# Patient Record
Sex: Male | Born: 1945 | Race: White | Hispanic: No | Marital: Single | Smoking: Heavy tobacco smoker
Health system: Southern US, Community
[De-identification: ages and names within clinical notes are randomized; demographics above are authoritative.]

## PROBLEM LIST (undated history)

## (undated) DIAGNOSIS — C799 Secondary malignant neoplasm of unspecified site: Secondary | ICD-10-CM

## (undated) DIAGNOSIS — C61 Malignant neoplasm of prostate: Secondary | ICD-10-CM

---

## 2013-01-14 ENCOUNTER — Emergency Department (HOSPITAL_COMMUNITY): Payer: Medicare Other

## 2013-01-14 ENCOUNTER — Emergency Department (HOSPITAL_COMMUNITY)
Admission: EM | Admit: 2013-01-14 | Discharge: 2013-01-14 | Disposition: A | Discharge status: Home / Self Care | Payer: Medicare Other | Attending: Emergency Medicine | Admitting: Emergency Medicine

## 2013-01-14 ENCOUNTER — Encounter (HOSPITAL_COMMUNITY): Payer: Self-pay | Admitting: *Deleted

## 2013-01-14 DIAGNOSIS — R4182 Altered mental status, unspecified: Secondary | ICD-10-CM | POA: Insufficient documentation

## 2013-01-14 DIAGNOSIS — F17200 Nicotine dependence, unspecified, uncomplicated: Secondary | ICD-10-CM | POA: Insufficient documentation

## 2013-01-14 DIAGNOSIS — W19XXXA Unspecified fall, initial encounter: Secondary | ICD-10-CM

## 2013-01-14 DIAGNOSIS — F1010 Alcohol abuse, uncomplicated: Secondary | ICD-10-CM | POA: Insufficient documentation

## 2013-01-14 DIAGNOSIS — S0180XA Unspecified open wound of other part of head, initial encounter: Secondary | ICD-10-CM | POA: Insufficient documentation

## 2013-01-14 DIAGNOSIS — F10929 Alcohol use, unspecified with intoxication, unspecified: Secondary | ICD-10-CM

## 2013-01-14 DIAGNOSIS — Y939 Activity, unspecified: Secondary | ICD-10-CM | POA: Insufficient documentation

## 2013-01-14 DIAGNOSIS — Z23 Encounter for immunization: Secondary | ICD-10-CM | POA: Insufficient documentation

## 2013-01-14 DIAGNOSIS — IMO0002 Reserved for concepts with insufficient information to code with codable children: Secondary | ICD-10-CM | POA: Insufficient documentation

## 2013-01-14 DIAGNOSIS — S0181XA Laceration without foreign body of other part of head, initial encounter: Secondary | ICD-10-CM

## 2013-01-14 DIAGNOSIS — T07XXXA Unspecified multiple injuries, initial encounter: Secondary | ICD-10-CM

## 2013-01-14 DIAGNOSIS — W1809XA Striking against other object with subsequent fall, initial encounter: Secondary | ICD-10-CM | POA: Insufficient documentation

## 2013-01-14 DIAGNOSIS — Y9289 Other specified places as the place of occurrence of the external cause: Secondary | ICD-10-CM | POA: Insufficient documentation

## 2013-01-14 MED ORDER — TETANUS-DIPHTH-ACELL PERTUSSIS 5-2.5-18.5 LF-MCG/0.5 IM SUSP
0.5000 mL | Freq: Once | INTRAMUSCULAR | Status: AC
  Administered 2013-01-14: 0.5 mL via INTRAMUSCULAR

## 2013-01-14 MED ORDER — LORAZEPAM 2 MG/ML IJ SOLN
2.0000 mg | Freq: Once | INTRAMUSCULAR | Status: AC
  Administered 2013-01-14: 2 mg via INTRAMUSCULAR
  Filled 2013-01-14: qty 1

## 2013-01-14 MED ORDER — ZIPRASIDONE MESYLATE 20 MG IM SOLR
10.0000 mg | Freq: Once | INTRAMUSCULAR | Status: AC
  Administered 2013-01-14: 10 mg via INTRAMUSCULAR

## 2013-01-14 NOTE — ED Notes (Signed)
Patient transported to CT 

## 2013-01-14 NOTE — ED Notes (Signed)
MD at bedside. Restraints removed

## 2013-01-14 NOTE — ED Notes (Signed)
MD Bednar in to eval. Patient with unsteady gait. Will offer food and fluids to see if he tolerates.

## 2013-01-14 NOTE — ED Notes (Signed)
MD at bedside.  EDP Bednar updated on pt current at status and reports OK for discharge

## 2013-01-14 NOTE — ED Notes (Signed)
Patient was picked up by EMS after falling and hitting his head. Patient has a 2cm laceration to left eyebrow.  He states that he  Has ETOH on board.

## 2013-01-14 NOTE — ED Provider Notes (Signed)
Pt mumbles incoherently to voice stimulus, not following commands yet. 0915  Awake alert oriented x3 he states he lives alone follows commands able to sit unassisted and walk unassisted but still slightly ataxic gait will observe him further in the emergency department due to initial gait instability, admits to drinking heavily last night, denies threats to hurt himself or others .1130  Patient now walking well independently in the emergency department and feels ready for discharge.  Hurman Horn, MD 01/19/13 [phone]

## 2013-01-14 NOTE — ED Notes (Signed)
Patient continues to sleep waking up periodically with loud moaning.

## 2013-01-14 NOTE — ED Provider Notes (Signed)
History     CSN: [id number]  Arrival date & time 01/14/13  0207   First MD Initiated Contact with Patient 01/14/13 0230      Chief Complaint  Patient presents with  . Fall  . Facial Laceration    (Consider location/radiation/quality/duration/timing/severity/associated sxs/prior treatment) HPI 67 year old male presents emergency part via EMS and police after falling in a parking lot.  Patient is very intoxicated.  He was looking for his car, which had been towed.  Patient fell forward.  He is unable to me if he passed out.  Patient with lacerations and bleeding from his face and forehead  He reports his last tetanus shot was at age 79.  No other history is able to be obtained secondary to alcohol intoxication History reviewed. No pertinent past medical history.  History reviewed. No pertinent past surgical history.  History reviewed. No pertinent family history.  History  Substance Use Topics  . Smoking status: Heavy Tobacco Smoker  . Smokeless tobacco: Not on file  . Alcohol Use: Yes      Review of Systems  Unable to perform ROS: Mental status change   Intoxicated Allergies  Review of patient's allergies indicates no known allergies.  Home Medications  No current outpatient prescriptions on file.  BP 176/88  Pulse 72  Temp(Src) 0 F (-17.8 C)  Resp 20  SpO2 100%  Physical Exam  Nursing note and vitals reviewed. HENT:  Right Ear: External ear normal.  Left Ear: External ear normal.  Nose: Nose normal.  Mouth/Throat: Oropharynx is clear and moist.  3 cm C-shaped laceration above left eyebrow.  Two  1 cm abrasions to left face at zygomatic arch laterally  Eyes: Conjunctivae and EOM are normal. Pupils are equal, round, and reactive to light.  Neck: Normal range of motion. Neck supple. No JVD present. No tracheal deviation present. No thyromegaly present.  Cardiovascular: Normal rate, regular rhythm, normal heart sounds and intact distal pulses.  Exam reveals no  gallop and no friction rub.   No murmur heard. Pulmonary/Chest: Effort normal and breath sounds normal. No stridor. No respiratory distress. He has no wheezes. He has no rales. He exhibits no tenderness.  Abdominal: Soft. Bowel sounds are normal. He exhibits no distension and no mass. There is no tenderness. There is no rebound and no guarding.  Musculoskeletal: Normal range of motion. He exhibits no edema and no tenderness.  Lymphadenopathy:    He has no cervical adenopathy.  Skin: Skin is warm. No rash noted. No erythema. No pallor.  Psychiatric:  Patient is intoxicated, hostile at times, irritable    ED Course  Procedures (including critical care time)  LACERATION REPAIR Performed by: Olivia Mackie Authorized by: Olivia Mackie Consent: Verbal consent obtained. Risks and benefits: risks, benefits and alternatives were discussed Consent given by: patient Patient identity confirmed: provided demographic data Prepped and Draped in normal sterile fashion Wound explored  Laceration Location: left forehead  Laceration Length: 4cm  No Foreign Bodies seen or palpated  Anesthesia: local infiltration  Local anesthetic: lidocaine 2% with epinephrine  Anesthetic total:8 ml  Irrigation method: syringe, wound cleaning spray, washcloth, peroxide. Amount of cleaning: extensive  Skin closure:5.0 prolene  Number of sutures: 11  Technique: simple interrupted  Patient tolerance: Patient tolerated the procedure well with no immediate complications.  Labs Reviewed - No data to display Ct Head Wo Contrast  01/14/2013  *RADIOLOGY REPORT*  Clinical Data:  Status post fall, with laceration at the left eyebrow.  Concern for  head or cervical spine injury.  CT HEAD WITHOUT CONTRAST AND CT CERVICAL SPINE WITHOUT CONTRAST  Technique:  Multidetector CT imaging of the head and cervical spine was performed following the standard protocol without intravenous contrast.  Multiplanar CT image  reconstructions of the cervical spine were also generated.  Comparison: None  CT HEAD  Findings: There is no evidence of acute infarction, mass lesion, or intra- or extra-axial hemorrhage on CT.  The posterior fossa, including the cerebellum, brainstem and fourth ventricle, is within normal limits.  The third and lateral ventricles, and basal ganglia are unremarkable in appearance.  The cerebral hemispheres are symmetric in appearance, with normal gray- white differentiation.  No mass effect or midline shift is seen.  There is no evidence of fracture; visualized osseous structures are unremarkable in appearance.  The orbits are within normal limits. There is opacification of the mastoid air cells bilaterally.  The remaining paranasal sinuses are well-aerated.  Soft tissue swelling and soft tissue disruption are seen overlying the left frontal calvarium, with mild soft tissue swelling tracking about the left orbit, involving the left eyelids.  A large amount of cerumen is noted at the right external auditory canal, with an apparent associated small osseous protrusion into the canal.  This likely reflects remote injury.  IMPRESSION:  1.  No evidence of traumatic intracranial injury or fracture. 2.  Soft tissue swelling and soft tissue disruption overlying the left and calvarium, with mild soft tissue swelling tracking about the left orbit, involving the left eyelids. 3.  Opacification of the mastoid air cells bilaterally. 4.  Large amount of cerumen at the right external auditory canal, with an apparent associated small osseous protrusion into the canal.  The osseous protrusion likely reflects remote injury.  CT CERVICAL SPINE  Findings: There is no evidence of acute fracture or subluxation. There is grade 1 anterolisthesis of C4 on C5, with multilevel disc space narrowing and associated anterior and posterior disc osteophyte complexes along the lower cervical spine.  Facet disease is noted along the mid cervical  spine.  Vertebral bodies demonstrate normal height.  Prevertebral soft tissues are within normal limits.  The visualized portions of the thyroid gland are unremarkable in appearance.  The visualized lung apices are clear.  Calcification is noted at the carotid bifurcations bilaterally.  IMPRESSION:  1.  No evidence of acute fracture or subluxation along the cervical spine. 2.  Degenerative change noted along the cervical spine. 3.  Calcification at the carotid bifurcations bilaterally.   Original Report Authenticated By: Tonia Ghent, M.D.    Ct Cervical Spine Wo Contrast  01/14/2013  *RADIOLOGY REPORT*  Clinical Data:  Status post fall, with laceration at the left eyebrow.  Concern for head or cervical spine injury.  CT HEAD WITHOUT CONTRAST AND CT CERVICAL SPINE WITHOUT CONTRAST  Technique:  Multidetector CT imaging of the head and cervical spine was performed following the standard protocol without intravenous contrast.  Multiplanar CT image reconstructions of the cervical spine were also generated.  Comparison: None  CT HEAD  Findings: There is no evidence of acute infarction, mass lesion, or intra- or extra-axial hemorrhage on CT.  The posterior fossa, including the cerebellum, brainstem and fourth ventricle, is within normal limits.  The third and lateral ventricles, and basal ganglia are unremarkable in appearance.  The cerebral hemispheres are symmetric in appearance, with normal gray- white differentiation.  No mass effect or midline shift is seen.  There is no evidence of fracture; visualized osseous structures are  unremarkable in appearance.  The orbits are within normal limits. There is opacification of the mastoid air cells bilaterally.  The remaining paranasal sinuses are well-aerated.  Soft tissue swelling and soft tissue disruption are seen overlying the left frontal calvarium, with mild soft tissue swelling tracking about the left orbit, involving the left eyelids.  A large amount of cerumen is  noted at the right external auditory canal, with an apparent associated small osseous protrusion into the canal.  This likely reflects remote injury.  IMPRESSION:  1.  No evidence of traumatic intracranial injury or fracture. 2.  Soft tissue swelling and soft tissue disruption overlying the left and calvarium, with mild soft tissue swelling tracking about the left orbit, involving the left eyelids. 3.  Opacification of the mastoid air cells bilaterally. 4.  Large amount of cerumen at the right external auditory canal, with an apparent associated small osseous protrusion into the canal.  The osseous protrusion likely reflects remote injury.  CT CERVICAL SPINE  Findings: There is no evidence of acute fracture or subluxation. There is grade 1 anterolisthesis of C4 on C5, with multilevel disc space narrowing and associated anterior and posterior disc osteophyte complexes along the lower cervical spine.  Facet disease is noted along the mid cervical spine.  Vertebral bodies demonstrate normal height.  Prevertebral soft tissues are within normal limits.  The visualized portions of the thyroid gland are unremarkable in appearance.  The visualized lung apices are clear.  Calcification is noted at the carotid bifurcations bilaterally.  IMPRESSION:  1.  No evidence of acute fracture or subluxation along the cervical spine. 2.  Degenerative change noted along the cervical spine. 3.  Calcification at the carotid bifurcations bilaterally.   Original Report Authenticated By: Tonia Ghent, M.D.      1. Fall, initial encounter   2. Forehead laceration, initial encounter   3. Multiple abrasions   4. Alcohol intoxication       MDM  67 year old male status post fall.  We'll update his tetanus.  Given the level of intoxication and affect, but he refuses to stay in the bed and is at great risk of further falling.  Will give Geodon.  Patient also has been abusive toward staff, and given risk for harm,willl place in  restraints.  Plan for head and C-spine CT scan, and repair of lacerations        Olivia Mackie, MD 01/14/13 [phone]

## 2013-01-14 NOTE — ED Notes (Signed)
Patient attempted to get out of bed.-stated he had to "take a leak". Has some bleeding just below sutures. Unsure if patient hit it on the bed rail-bleeding now controlled.

## 2014-04-04 IMAGING — CT CT HEAD W/O CM
4 series · 17 of 40 positions shown, 18 images · non-contrast
Comparison: None

CT HEAD

CLINICAL DATA: Status post fall, with laceration at the left
eyebrow.  Concern for head or cervical spine injury.

CT HEAD WITHOUT CONTRAST AND CT CERVICAL SPINE WITHOUT CONTRAST
TECHNIQUE: Multidetector CT imaging of the head and cervical spine
was performed following the standard protocol without intravenous
contrast.  Multiplanar CT image reconstructions of the cervical
spine were also generated.

[Series 3: head w/o · axial · non-contrast · 0.43mm/px · z∈[-136,-46]mm · 4 of 30 slices shown, 5 images]
[im 6/30  brain]
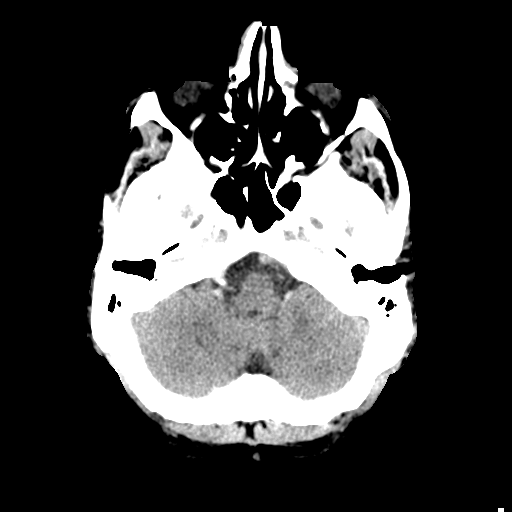
[im 6/30  bone]
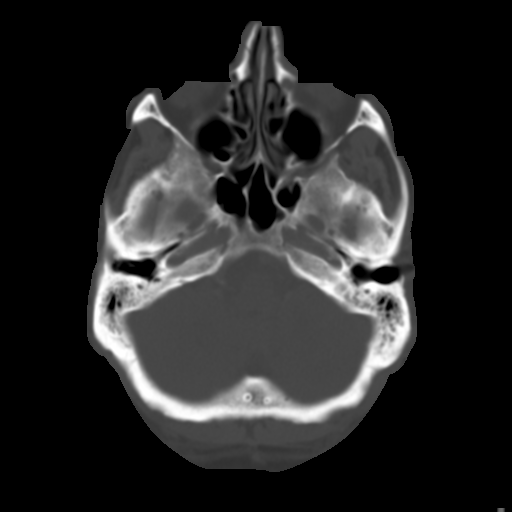
[im 12/30  brain]
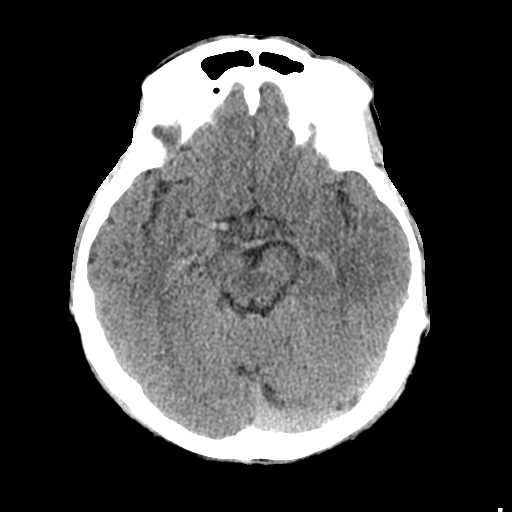
[im 18/30  brain]
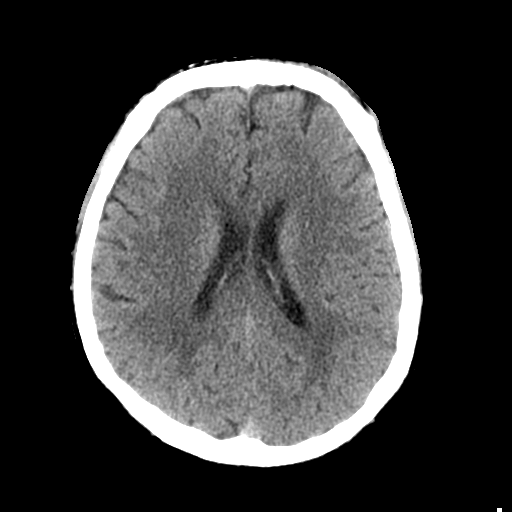
[im 24/30  brain]
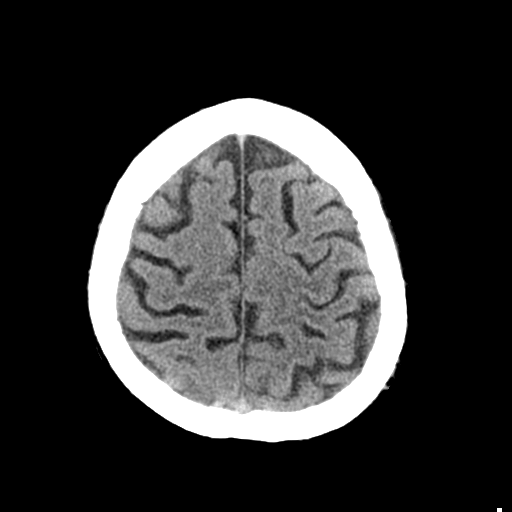

[Series 4: bone windows · axial · 0.43mm/px · z∈[-143,-35]mm · 7 of 50 slices shown]
[im 7/50  bone]
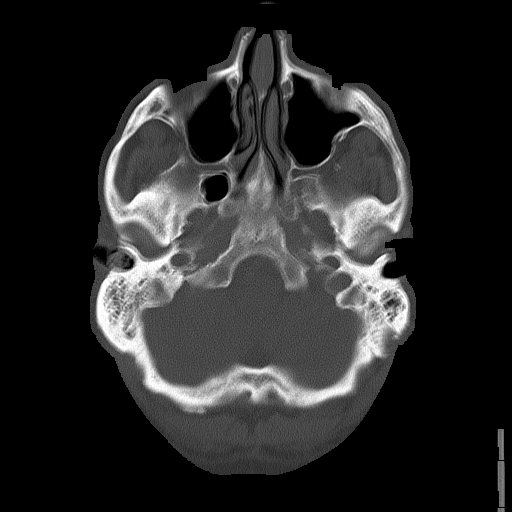
[im 13/50  bone]
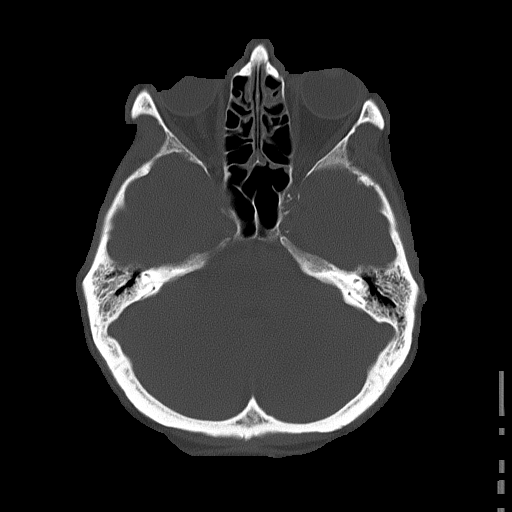
[im 19/50  bone]
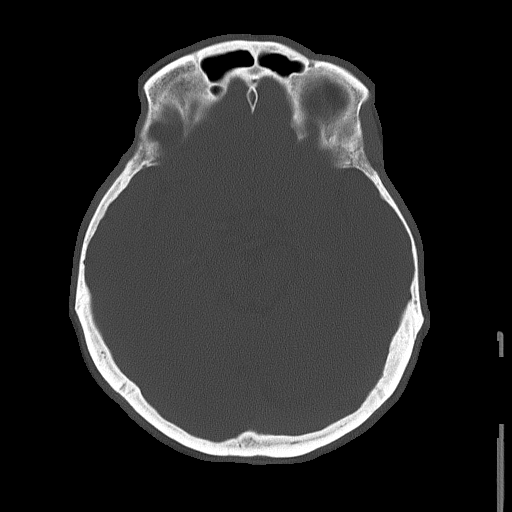
[im 25/50  bone]
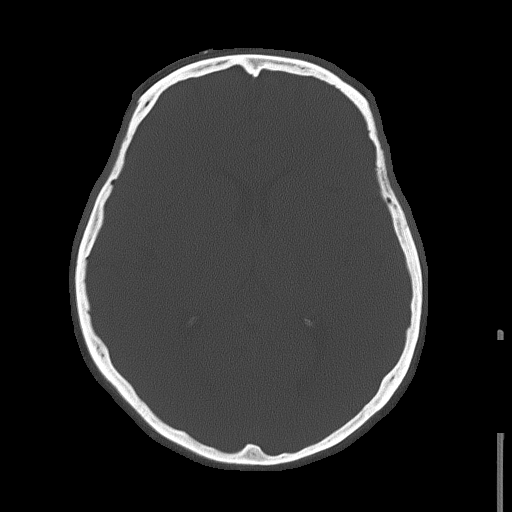
[im 31/50  bone]
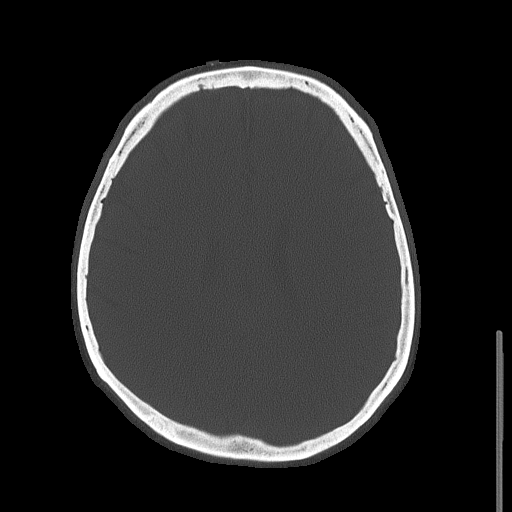
[im 37/50  bone]
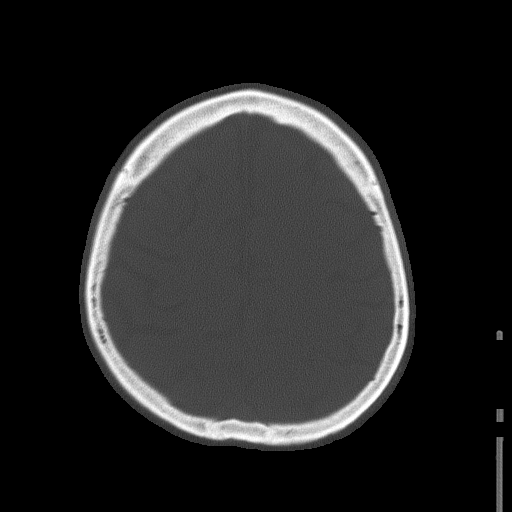
[im 43/50  bone]
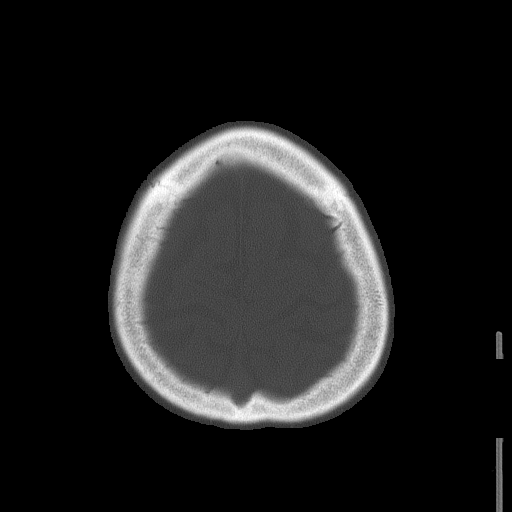

[Series 5: c-spine st · axial · 0.27mm/px · z∈[-304,-258]mm · 3 of 80 slices shown]
[im 6/80  brain]
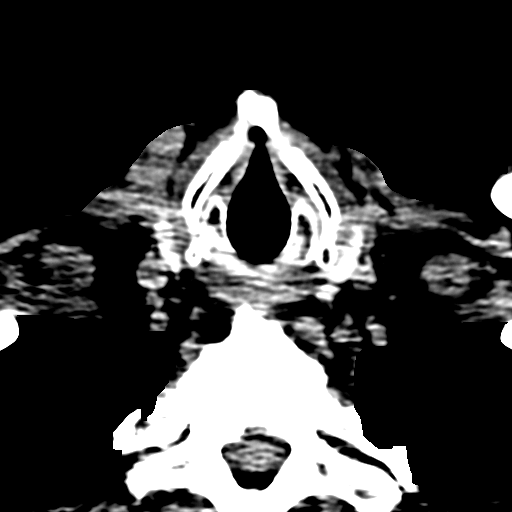
[im 17/80  brain]
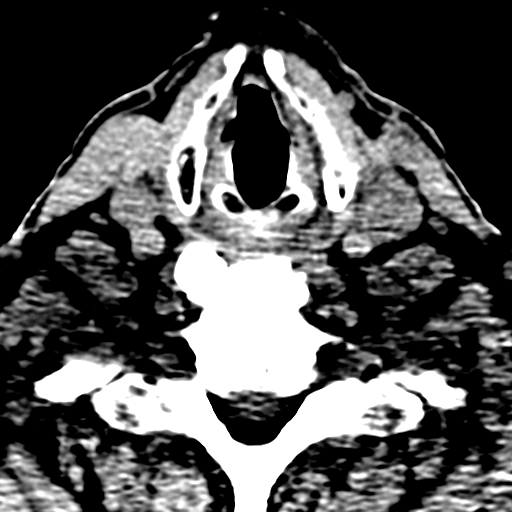
[im 29/80  brain]
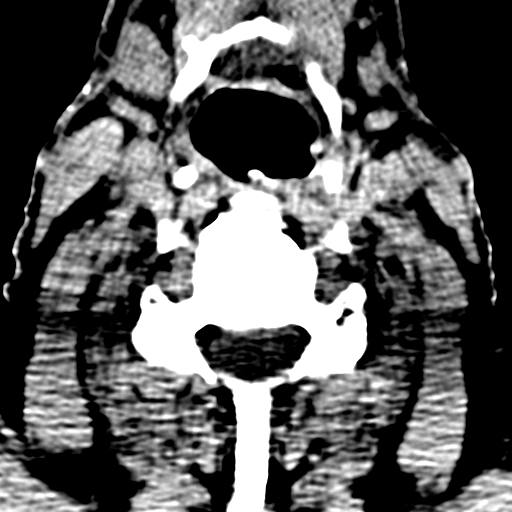

[Series 8: axial recon · coronal · 0.23mm/px · 3 of 90 slices shown]
[im 71/90  brain]
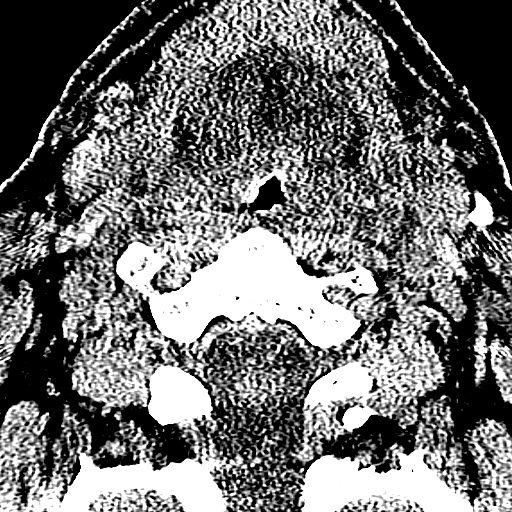
[im 74/90  brain]
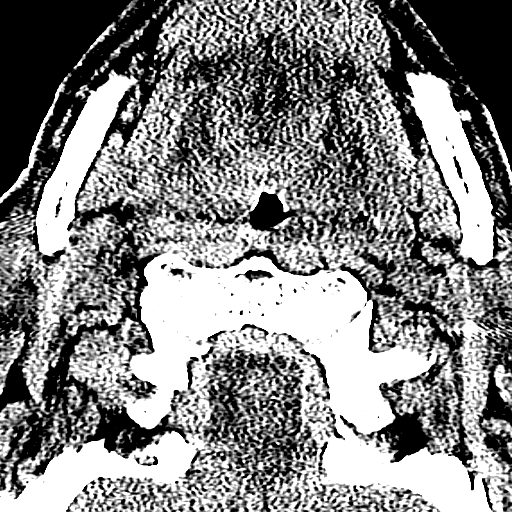
[im 77/90  brain]
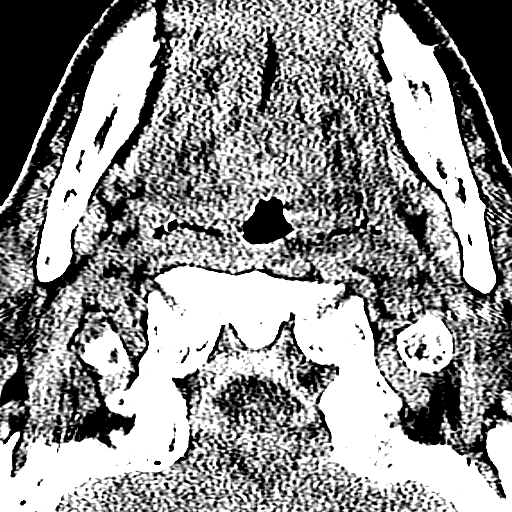

[17 of 40 positions shown; findings below may reference images not displayed]

FINDINGS: There is no evidence of acute infarction, mass lesion, or
intra- or extra-axial hemorrhage on CT.

The posterior fossa, including the cerebellum, brainstem and fourth
ventricle, is within normal limits.  The third and lateral
ventricles, and basal ganglia are unremarkable in appearance.  The
cerebral hemispheres are symmetric in appearance, with normal gray-
white differentiation.  No mass effect or midline shift is seen.

There is no evidence of fracture; visualized osseous structures are
unremarkable in appearance.  The orbits are within normal limits.
There is opacification of the mastoid air cells bilaterally.  The
remaining paranasal sinuses are well-aerated.  Soft tissue swelling
and soft tissue disruption are seen overlying the left frontal
calvarium, with mild soft tissue swelling tracking about the left
orbit, involving the left eyelids.

A large amount of cerumen is noted at the right external auditory
canal, with an apparent associated small osseous protrusion into
the canal.  This likely reflects remote injury.
IMPRESSION: 1.  No evidence of traumatic intracranial injury or fracture.
2.  Soft tissue swelling and soft tissue disruption overlying the
left and calvarium, with mild soft tissue swelling tracking about
the left orbit, involving the left eyelids.
3.  Opacification of the mastoid air cells bilaterally.
4.  Large amount of cerumen at the right external auditory canal,
with an apparent associated small osseous protrusion into the
canal.  The osseous protrusion likely reflects remote injury.

CT CERVICAL SPINE
FINDINGS: There is no evidence of acute fracture or subluxation.
There is grade 1 anterolisthesis of C4 on C5, with multilevel disc
space narrowing and associated anterior and posterior disc
osteophyte complexes along the lower cervical spine.  Facet disease
is noted along the mid cervical spine.  Vertebral bodies
demonstrate normal height.  Prevertebral soft tissues are within
normal limits.

The visualized portions of the thyroid gland are unremarkable in
appearance.  The visualized lung apices are clear.  Calcification
is noted at the carotid bifurcations bilaterally.
IMPRESSION: 1.  No evidence of acute fracture or subluxation along the cervical
spine.
2.  Degenerative change noted along the cervical spine.
3.  Calcification at the carotid bifurcations bilaterally.

## 2021-09-21 ENCOUNTER — Emergency Department (HOSPITAL_COMMUNITY): Payer: Medicare Other

## 2021-09-21 ENCOUNTER — Encounter (HOSPITAL_COMMUNITY): Payer: Self-pay

## 2021-09-21 ENCOUNTER — Other Ambulatory Visit: Payer: Self-pay

## 2021-09-21 ENCOUNTER — Observation Stay (HOSPITAL_COMMUNITY): Payer: Medicare Other

## 2021-09-21 ENCOUNTER — Inpatient Hospital Stay (HOSPITAL_COMMUNITY)
Admission: EM | Admit: 2021-09-21 | Discharge: 2021-09-24 | DRG: 722 | Disposition: A | Discharge status: Home / Self Care | Payer: Medicare Other | Attending: Internal Medicine | Admitting: Internal Medicine

## 2021-09-21 DIAGNOSIS — Q782 Osteopetrosis: Secondary | ICD-10-CM

## 2021-09-21 DIAGNOSIS — N179 Acute kidney failure, unspecified: Secondary | ICD-10-CM | POA: Diagnosis present

## 2021-09-21 DIAGNOSIS — R9431 Abnormal electrocardiogram [ECG] [EKG]: Secondary | ICD-10-CM | POA: Diagnosis not present

## 2021-09-21 DIAGNOSIS — C799 Secondary malignant neoplasm of unspecified site: Secondary | ICD-10-CM

## 2021-09-21 DIAGNOSIS — F1011 Alcohol abuse, in remission: Secondary | ICD-10-CM | POA: Diagnosis present

## 2021-09-21 DIAGNOSIS — C61 Malignant neoplasm of prostate: Secondary | ICD-10-CM | POA: Diagnosis not present

## 2021-09-21 DIAGNOSIS — R64 Cachexia: Secondary | ICD-10-CM | POA: Diagnosis present

## 2021-09-21 DIAGNOSIS — J90 Pleural effusion, not elsewhere classified: Secondary | ICD-10-CM | POA: Diagnosis not present

## 2021-09-21 DIAGNOSIS — N400 Enlarged prostate without lower urinary tract symptoms: Secondary | ICD-10-CM | POA: Diagnosis present

## 2021-09-21 DIAGNOSIS — R0600 Dyspnea, unspecified: Secondary | ICD-10-CM

## 2021-09-21 DIAGNOSIS — E872 Acidosis, unspecified: Secondary | ICD-10-CM | POA: Diagnosis present

## 2021-09-21 DIAGNOSIS — R195 Other fecal abnormalities: Secondary | ICD-10-CM | POA: Diagnosis present

## 2021-09-21 DIAGNOSIS — J910 Malignant pleural effusion: Secondary | ICD-10-CM | POA: Diagnosis present

## 2021-09-21 DIAGNOSIS — Z681 Body mass index (BMI) 19 or less, adult: Secondary | ICD-10-CM

## 2021-09-21 DIAGNOSIS — Z9889 Other specified postprocedural states: Secondary | ICD-10-CM

## 2021-09-21 DIAGNOSIS — R0602 Shortness of breath: Secondary | ICD-10-CM | POA: Diagnosis not present

## 2021-09-21 DIAGNOSIS — Z8249 Family history of ischemic heart disease and other diseases of the circulatory system: Secondary | ICD-10-CM

## 2021-09-21 DIAGNOSIS — E8809 Other disorders of plasma-protein metabolism, not elsewhere classified: Secondary | ICD-10-CM | POA: Diagnosis present

## 2021-09-21 DIAGNOSIS — Z801 Family history of malignant neoplasm of trachea, bronchus and lung: Secondary | ICD-10-CM

## 2021-09-21 DIAGNOSIS — R188 Other ascites: Secondary | ICD-10-CM

## 2021-09-21 DIAGNOSIS — D6959 Other secondary thrombocytopenia: Secondary | ICD-10-CM | POA: Diagnosis present

## 2021-09-21 DIAGNOSIS — E43 Unspecified severe protein-calorie malnutrition: Secondary | ICD-10-CM | POA: Diagnosis present

## 2021-09-21 DIAGNOSIS — F17210 Nicotine dependence, cigarettes, uncomplicated: Secondary | ICD-10-CM | POA: Diagnosis present

## 2021-09-21 DIAGNOSIS — D539 Nutritional anemia, unspecified: Secondary | ICD-10-CM | POA: Diagnosis present

## 2021-09-21 DIAGNOSIS — C7951 Secondary malignant neoplasm of bone: Secondary | ICD-10-CM | POA: Diagnosis present

## 2021-09-21 DIAGNOSIS — Z20822 Contact with and (suspected) exposure to covid-19: Secondary | ICD-10-CM | POA: Diagnosis present

## 2021-09-21 DIAGNOSIS — N1330 Unspecified hydronephrosis: Secondary | ICD-10-CM | POA: Diagnosis present

## 2021-09-21 DIAGNOSIS — J9811 Atelectasis: Secondary | ICD-10-CM | POA: Diagnosis present

## 2021-09-21 DIAGNOSIS — R0902 Hypoxemia: Secondary | ICD-10-CM | POA: Diagnosis present

## 2021-09-21 DIAGNOSIS — D696 Thrombocytopenia, unspecified: Secondary | ICD-10-CM | POA: Diagnosis present

## 2021-09-21 DIAGNOSIS — E875 Hyperkalemia: Secondary | ICD-10-CM | POA: Diagnosis present

## 2021-09-21 DIAGNOSIS — Z66 Do not resuscitate: Secondary | ICD-10-CM | POA: Diagnosis not present

## 2021-09-21 LAB — ECHOCARDIOGRAM COMPLETE
Height: 73 in
S' Lateral: 3 cm
Weight: 2160 oz

## 2021-09-21 LAB — COMPREHENSIVE METABOLIC PANEL
ALT: 21 U/L (ref 0–44)
AST: 40 U/L (ref 15–41)
Albumin: 3 g/dL — ABNORMAL LOW (ref 3.5–5.0)
Alkaline Phosphatase: 683 U/L — ABNORMAL HIGH (ref 38–126)
Anion gap: 6 (ref 5–15)
BUN: 29 mg/dL — ABNORMAL HIGH (ref 8–23)
CO2: 17 mmol/L — ABNORMAL LOW (ref 22–32)
Calcium: 7.9 mg/dL — ABNORMAL LOW (ref 8.9–10.3)
Chloride: 117 mmol/L — ABNORMAL HIGH (ref 98–111)
Creatinine, Ser: 1.37 mg/dL — ABNORMAL HIGH (ref 0.61–1.24)
GFR, Estimated: 53 mL/min — ABNORMAL LOW (ref >60)
Glucose, Bld: 97 mg/dL (ref 70–99)
Potassium: 5.4 mmol/L — ABNORMAL HIGH (ref 3.5–5.1)
Sodium: 140 mmol/L (ref 135–145)
Total Bilirubin: 0.3 mg/dL (ref 0.3–1.2)
Total Protein: 5.9 g/dL — ABNORMAL LOW (ref 6.5–8.1)

## 2021-09-21 LAB — HEMOGLOBIN AND HEMATOCRIT, BLOOD
HCT: 23.7 % — ABNORMAL LOW (ref 39.0–52.0)
Hemoglobin: 7 g/dL — ABNORMAL LOW (ref 13.0–17.0)

## 2021-09-21 LAB — CBC WITH DIFFERENTIAL/PLATELET
Abs Immature Granulocytes: 0.68 10*3/uL — ABNORMAL HIGH (ref 0.00–0.07)
Basophils Absolute: 0.1 10*3/uL (ref 0.0–0.1)
Basophils Relative: 1 %
Eosinophils Absolute: 0.1 10*3/uL (ref 0.0–0.5)
Eosinophils Relative: 1 %
HCT: 26.2 % — ABNORMAL LOW (ref 39.0–52.0)
Hemoglobin: 7.7 g/dL — ABNORMAL LOW (ref 13.0–17.0)
Immature Granulocytes: 8 %
Lymphocytes Relative: 45 %
Lymphs Abs: 3.6 10*3/uL (ref 0.7–4.0)
MCH: 32 pg (ref 26.0–34.0)
MCHC: 29.4 g/dL — ABNORMAL LOW (ref 30.0–36.0)
MCV: 108.7 fL — ABNORMAL HIGH (ref 80.0–100.0)
Monocytes Absolute: 1.1 10*3/uL — ABNORMAL HIGH (ref 0.1–1.0)
Monocytes Relative: 14 %
Neutro Abs: 2.5 10*3/uL (ref 1.7–7.7)
Neutrophils Relative %: 31 %
Platelets: 52 10*3/uL — ABNORMAL LOW (ref 150–400)
RBC: 2.41 MIL/uL — ABNORMAL LOW (ref 4.22–5.81)
RDW: 19.7 % — ABNORMAL HIGH (ref 11.5–15.5)
WBC: 8.1 10*3/uL (ref 4.0–10.5)
nRBC: 12.3 % — ABNORMAL HIGH (ref 0.0–0.2)

## 2021-09-21 LAB — POC OCCULT BLOOD, ED: Fecal Occult Bld: POSITIVE — AB

## 2021-09-21 LAB — FERRITIN: Ferritin: 89 ng/mL (ref 24–336)

## 2021-09-21 LAB — BASIC METABOLIC PANEL
Anion gap: 7 (ref 5–15)
BUN: 35 mg/dL — ABNORMAL HIGH (ref 8–23)
CO2: 20 mmol/L — ABNORMAL LOW (ref 22–32)
Calcium: 7.9 mg/dL — ABNORMAL LOW (ref 8.9–10.3)
Chloride: 113 mmol/L — ABNORMAL HIGH (ref 98–111)
Creatinine, Ser: 1.43 mg/dL — ABNORMAL HIGH (ref 0.61–1.24)
GFR, Estimated: 50 mL/min — ABNORMAL LOW (ref >60)
Glucose, Bld: 98 mg/dL (ref 70–99)
Potassium: 5.4 mmol/L — ABNORMAL HIGH (ref 3.5–5.1)
Sodium: 140 mmol/L (ref 135–145)

## 2021-09-21 LAB — RETICULOCYTES
Immature Retic Fract: 46.3 % — ABNORMAL HIGH (ref 2.3–15.9)
RBC.: 2.25 MIL/uL — ABNORMAL LOW (ref 4.22–5.81)
Retic Count, Absolute: 65 10*3/uL (ref 19.0–186.0)
Retic Ct Pct: 2.9 % (ref 0.4–3.1)

## 2021-09-21 LAB — IRON AND TIBC
Iron: 115 ug/dL (ref 45–182)
Saturation Ratios: 31 % (ref 17.9–39.5)
TIBC: 372 ug/dL (ref 250–450)
UIBC: 257 ug/dL

## 2021-09-21 LAB — RESP PANEL BY RT-PCR (FLU A&B, COVID) ARPGX2
Influenza A by PCR: NEGATIVE
Influenza B by PCR: NEGATIVE
SARS Coronavirus 2 by RT PCR: NEGATIVE

## 2021-09-21 LAB — TYPE AND SCREEN
ABO/RH(D): O POS
Antibody Screen: NEGATIVE

## 2021-09-21 LAB — BRAIN NATRIURETIC PEPTIDE: B Natriuretic Peptide: 474.5 pg/mL — ABNORMAL HIGH (ref 0.0–100.0)

## 2021-09-21 LAB — VITAMIN B12: Vitamin B-12: 418 pg/mL (ref 180–914)

## 2021-09-21 LAB — FOLATE: Folate: 6.7 ng/mL (ref >5.9)

## 2021-09-21 MED ORDER — ALBUTEROL SULFATE (2.5 MG/3ML) 0.083% IN NEBU: 2.5000 mg | INHALATION_SOLUTION | RESPIRATORY_TRACT | Status: DC | PRN

## 2021-09-21 MED ORDER — ONDANSETRON HCL 4 MG/2ML IJ SOLN: 4.0000 mg | Freq: Four times a day (QID) | INTRAMUSCULAR | Status: DC | PRN

## 2021-09-21 MED ORDER — IOHEXOL 350 MG/ML SOLN
80.0000 mL | Freq: Once | INTRAVENOUS | Status: AC | PRN
  Administered 2021-09-21: 19:00:00 80 mL via INTRAVENOUS

## 2021-09-21 MED ORDER — ALBUTEROL SULFATE HFA 108 (90 BASE) MCG/ACT IN AERS
2.0000 | INHALATION_SPRAY | RESPIRATORY_TRACT | Status: DC | PRN
  Administered 2021-09-21: 10:00:00 2 via RESPIRATORY_TRACT
  Filled 2021-09-21: qty 6.7

## 2021-09-21 MED ORDER — IOHEXOL 9 MG/ML PO SOLN: 500.0000 mL | ORAL | Status: AC

## 2021-09-21 MED ORDER — NICOTINE 21 MG/24HR TD PT24: 21.0000 mg | MEDICATED_PATCH | Freq: Every day | TRANSDERMAL | Status: DC | PRN

## 2021-09-21 MED ORDER — SODIUM CHLORIDE 0.9 % IV SOLN: INTRAVENOUS | Status: DC

## 2021-09-21 MED ORDER — IOHEXOL 9 MG/ML PO SOLN
ORAL | Status: AC
  Filled 2021-09-21: qty 1000

## 2021-09-21 MED ORDER — FUROSEMIDE 10 MG/ML IJ SOLN
40.0000 mg | Freq: Once | INTRAMUSCULAR | Status: AC
  Administered 2021-09-21: 12:00:00 40 mg via INTRAVENOUS
  Filled 2021-09-21: qty 4

## 2021-09-21 MED ORDER — ONDANSETRON HCL 4 MG PO TABS
4.0000 mg | ORAL_TABLET | Freq: Four times a day (QID) | ORAL | Status: DC | PRN
  Filled 2021-09-21: qty 1

## 2021-09-21 MED ORDER — ACETAMINOPHEN 650 MG RE SUPP: 650.0000 mg | Freq: Four times a day (QID) | RECTAL | Status: DC | PRN

## 2021-09-21 MED ORDER — ACETAMINOPHEN 325 MG PO TABS: 650.0000 mg | ORAL_TABLET | Freq: Four times a day (QID) | ORAL | Status: DC | PRN

## 2021-09-21 NOTE — H&P (Signed)
History and Physical    TEXAS SOUTER EQA:[id number] DOB: [birth year] DOA: 09/21/2021  PCP: Pcp, No   Patient coming from: Home.  I have personally briefly reviewed patient's old medical records in [place]  Chief Complaint: Shortness of breath and abnormal chest x-ray.  HPI: Brian Cameron is a 75 y.o. 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  The patient waited until today to come to the emergency department and his dyspnea has been progressively worse.  He has lost over 40 pounds in the last few months.  He he has felt some generalized weakness, but denied fever, chills, night sweats, wheezing, hemoptysis or chest pain.  No palpitations, diaphoresis, dizziness, PND, orthopnea or pitting edema of the lower extremities.  Denied abdominal pain, nausea, emesis, diarrhea, constipation, melena or hematochezia.  No flank pain, dysuria, hematuria or frequency.  No polyuria, polydipsia, polyphagia or blurred vision.  ED Course: Initial vital signs temperature 97.5 F, pulse 81, respiration 18, BP 140/55 mmHg O2 sat 100% on room air.  Lab work: CBC is her white count 8.1, hemoglobin 7.7 g/dL platelets 52.  CMP showed a potassium of 5.4, chloride 117 and CO2 17 mmol/L.  Anion gap was normal.  Glucose 97, BUN 29 and creatinine 1.37 mg/dL.  Total protein 5.9 and albumin 3.0 g/dL.  Alkaline phosphatase was 683 units/L.  AST, ALT and bilirubin were normal.  BNP was 474.5 pg/mL.  Imaging: His chest radiograph shows bilateral pleural effusion and metastatic bone lesions.  Review of Systems: As per HPI otherwise all other systems reviewed and are negative.  History reviewed. No pertinent past medical history.  History  reviewed. No pertinent surgical history.  Social History  reports that he has been smoking cigarettes. He has been smoking an average of 1 pack per day. He has never used smokeless tobacco. He reports current alcohol use. He reports that he does not use drugs.  No Known Allergies  Family History  Problem Relation Age of Onset   Lung cancer Mother    Congestive Heart Failure Father    Prior to Admission medications   Medication Sig Start Date End Date Taking? Authorizing Provider  acetaminophen (TYLENOL) 500 MG tablet Take 1,000 mg by mouth every 6 (six) hours as needed for mild pain.   Yes [provider]  Aspirin-Caffeine (BC FAST PAIN RELIEF) 845-65 MG PACK Take 1 Package by mouth daily as needed (pain).   Yes [provider]    Physical Exam: Vitals:   09/21/21 1300 09/21/21 1330 09/21/21 1429 09/21/21 1525  BP: (!) 160/64 (!) 147/60 (!) 146/57 (!) 155/53  Pulse: 85 76 75 77  Resp:  18 18 18   Temp:   (!) 97.5 F (36.4 C) 97.7 F (36.5 C)  TempSrc:   Oral Oral  SpO2: 100% 100% 100% 100%  Weight:      Height:        Constitutional: Chronically ill-appearing, cachectic.  NAD, calm, comfortable. Eyes: PERRL, lids and conjunctivae normal.  Pale conjunctiva. ENMT: Mucous membranes are moist. Posterior pharynx clear of any exudate or lesions. Neck: normal, supple, no masses, no thyromegaly Respiratory: Decreased breath sounds in bases, no wheezing, no crackles. Normal respiratory effort. No  accessory muscle use.  Cardiovascular: Regular rate and rhythm, no murmurs / rubs / gallops. No extremity edema. 2+ pedal pulses. No carotid bruits.  Abdomen: No distention.  Soft, no tenderness, no masses palpated. No hepatosplenomegaly. Bowel sounds positive.  Musculoskeletal: Mild generalized weakness.  No clubbing / cyanosis. No joint deformity upper and lower extremities. Good ROM, no contractures. Normal muscle tone.  Skin: no acute rashes, lesions, ulcers on very  limited dermatological semination. Neurologic: CN 2-12 grossly intact. Sensation intact, DTR normal. Strength 5/5 in all 4.  Psychiatric: Alert and oriented x 3.  Flat affect.  The patient stated that we were trying to make money with his case.  He has refused to take contrast for CT scan.  Labs on Admission: I have personally reviewed following labs and imaging studies  CBC: Recent Labs  Lab 09/21/21 1030  WBC 8.1  NEUTROABS 2.5  HGB 7.7*  HCT 26.2*  MCV 108.7*  PLT 52*    Basic Metabolic Panel: Recent Labs  Lab 09/21/21 1030  NA 140  K 5.4*  CL 117*  CO2 17*  GLUCOSE 97  BUN 29*  CREATININE 1.37*  CALCIUM 7.9*    GFR: Estimated Creatinine Clearance: 39.1 mL/min (A) (by C-G formula based on SCr of 1.37 mg/dL (H)).  Liver Function Tests: Recent Labs  Lab 09/21/21 1030  AST 40  ALT 21  ALKPHOS 683*  BILITOT 0.3  PROT 5.9*  ALBUMIN 3.0*    Urine analysis: No results found for: COLORURINE, APPEARANCEUR, LABSPEC, [place], GLUCOSEU, HGBUR, BILIRUBINUR, KETONESUR, PROTEINUR, UROBILINOGEN, NITRITE, LEUKOCYTESUR  Radiological Exams on Admission: DG Chest 2 View  Result Date: 09/21/2021 CLINICAL DATA:  Shortness of breath, cough, weight loss EXAM: CHEST - 2 VIEW COMPARISON:  09/21/2021 FINDINGS: Normal heart size, mediastinal contours, and pulmonary vascularity. Atherosclerotic calcification aorta. Bibasilar effusions and atelectasis. Remaining lungs clear. No pneumothorax. Diffuse osteosclerosis, patchy, highly suspicious for osseous metastatic disease. Biconvex thoracic scoliosis. IMPRESSION: Bibasilar pleural effusions and atelectasis. Diffuse patchy osteosclerosis, highly suspicious for osseous metastatic disease. Electronically Signed   By: Lavonia Dana M.D.   On: 09/21/2021 10:52   ECHOCARDIOGRAM COMPLETE  Result Date: 09/21/2021    ECHOCARDIOGRAM REPORT   Patient Name:   Brian Cameron Date of Exam: 09/21/2021 Medical Rec #:  [id number]       Height:       73.0  in Accession #:    [id number]      Weight:       135.0 lb Date of Birth:  10/30/1945        BSA:          1.821 m Patient Age:    43 years        BP:           155/53 mmHg Patient Gender: M               HR:           75 bpm. Exam Location:  Inpatient Procedure: 2D Echo, Cardiac Doppler and Color Doppler Indications:    Abnormal ECG R94.31  History:        Patient has no prior history of Echocardiogram examinations.                 Abnormal ECG.  Sonographer:    Merrie Roof RDCS Referring Phys: [id number] [place]  Sonographer Comments: No apical window. Too skinny. Won't or can't lay on side IMPRESSIONS  1. Left ventricular ejection fraction, by estimation,  is 55 to 60%. The left ventricle has normal function. The left ventricle has no regional wall motion abnormalities. There is mild concentric left ventricular hypertrophy. Left ventricular diastolic function could not be evaluated.  2. Right ventricular systolic function is normal. The right ventricular size is normal. There is mildly elevated pulmonary artery systolic pressure.  3. Left atrial size was mildly dilated.  4. The mitral valve is normal in structure. Trivial mitral valve regurgitation.  5. Tricuspid valve regurgitation is mild to moderate.  6. The aortic valve is tricuspid. There is mild calcification of the aortic valve. There is mild thickening of the aortic valve. Aortic valve regurgitation is mild to moderate. Aortic valve sclerosis is present, with no evidence of aortic valve stenosis. FINDINGS  Left Ventricle: Left ventricular ejection fraction, by estimation, is 55 to 60%. The left ventricle has normal function. The left ventricle has no regional wall motion abnormalities. The left ventricular internal cavity size was normal in size. There is  mild concentric left ventricular hypertrophy. Left ventricular diastolic function could not be evaluated. Right Ventricle: The right ventricular size is normal. No increase in right ventricular  wall thickness. Right ventricular systolic function is normal. There is mildly elevated pulmonary artery systolic pressure. The tricuspid regurgitant velocity is 3.19  m/s, and with an assumed right atrial pressure of 3 mmHg, the estimated right ventricular systolic pressure is 95.0 mmHg. Left Atrium: Left atrial size was mildly dilated. Right Atrium: Right atrial size was normal in size. Prominent Chiari network. Pericardium: There is no evidence of pericardial effusion. Mitral Valve: The mitral valve is normal in structure. Trivial mitral valve regurgitation. Tricuspid Valve: The tricuspid valve is normal in structure. Tricuspid valve regurgitation is mild to moderate. Aortic Valve: The aortic valve is tricuspid. There is mild calcification of the aortic valve. There is mild thickening of the aortic valve. Aortic valve regurgitation is mild to moderate. Aortic valve sclerosis is present, with no evidence of aortic valve stenosis. Pulmonic Valve: The pulmonic valve was grossly normal. Pulmonic valve regurgitation is trivial. Aorta: The aortic root is normal in size and structure. IAS/Shunts: The interatrial septum was not well visualized.  LEFT VENTRICLE PLAX 2D LVIDd:         4.20 cm LVIDs:         3.00 cm LV PW:         1.20 cm LV IVS:        1.10 cm LVOT diam:     2.23 cm LVOT Area:     3.91 cm  LEFT ATRIUM         Index LA diam:    4.40 cm 2.42 cm/m   AORTA Ao Root diam: 3.20 cm TRICUSPID VALVE TR Peak grad:   40.7 mmHg TR Vmax:        319.00 cm/s  SHUNTS Systemic Diam: 2.23 cm Dani Gobble Croitoru MD Electronically signed by Sanda Klein MD Signature Date/Time: 09/21/2021/4:49:54 PM    Final     EKG: Independently reviewed.  Vent. rate 72 BPM PR interval 187 ms QRS duration 83 ms QT/QTcB 403/441 ms P-R-T axes 82 85 51 Sinus rhythm Anterior infarct, old  Assessment/Plan Principal Problem:   Bilateral pleural effusion In the setting of malignancy. Observation/telemetry. Supplemental oxygen as  needed. Obtain CT chest, abdomen and pelvis. IR will evaluate for thoracentesis tomorrow.  Active Problems:   Abnormal EKG Obtain echocardiogram.    Macrocytic anemia Check D32 and folic acid level. Monitor hematocrit hemoglobin. Transfuse as needed.  Hyperkalemia Received furosemide earlier. Follow-up potassium level.    Hypoalbuminemia Secondary to malignancy.    Thrombocytopenia ([place]) In the setting of occult malignancy. Monitor platelet count.    Positive fecal occult blood test Obtain CT abdomen/pelvis. Consult GI and/or general surgery in a.m.    DVT prophylaxis: SCDs. Code Status:   Full code. Family Communication:  His sister was at bedside. Disposition Plan:   Patient is from:  Home.  Anticipated DC to:  Home.  Anticipated DC date:  09/24/2021.  Anticipated DC barriers: Clinical status and pending work-up.  Consults called:  Interventional radiology Sandi Mariscal, MD) Admission status:  Observation/telemetry.  Severity of Illness:  High severity in the setting of bilateral malignant pleural effusion.  The patient will remain in the hospital for further work-up and thoracentesis by IR in the morning.  Reubin Milan MD [facility]  How to contact the [facility] Attending or Consulting provider [place] or covering provider during after hours [place], for this patient?   Check the care team in [facility] and look for a) attending/consulting [facility] provider listed and b) the [facility] team listed Log into [email or web address] and use [place]'s universal password to access. If you do not have the password, please contact the hospital operator. Locate the [facility] provider you are looking for under [facility] and page to a number that you can be directly reached. If you still have difficulty reaching the provider, please page the [facility] (Director on Call) for the Hospitalists listed on amion for assistance.  09/21/2021, 5:25 PM   This document was prepared using Dragon voice  recognition software and may contain some unintended transcription errors.

## 2021-09-21 NOTE — ED Notes (Signed)
Ready to receive patient. No questions with SBAR

## 2021-09-21 NOTE — ED Provider Notes (Signed)
[place] DEPT Provider Note   CSN: [id number] Arrival date & time: 09/21/21  2993     History Chief Complaint  Patient presents with   Shortness of Breath   Cough    Brian Cameron is a 75 y.o. male.  75 year old male presents with several months of dyspnea which is progressively gotten worse.  Notes nonproductive cough without hemoptysis.  Has had positive weight loss.  Denies any orthopnea.  Was seen in urgent care 7 days ago and had chest x-ray which showed bilateral pleural effusions.  He was told to go immediately to the ER at the time but he decided not to do this.  Since that time his dyspnea has been increasing.  Denies any chest pain.  No volume loss.  No blood in the stools.  No treatment use for this prior to arrival.  He does have a history of tobacco use but has never been diagnosed with COPD      History reviewed. No pertinent past medical history.  There are no problems to display for this patient.   History reviewed. No pertinent surgical history.     History reviewed. No pertinent family history.  Social History   Tobacco Use   Smoking status: Heavy Smoker    Packs/day: 1.00    Types: Cigarettes   Smokeless tobacco: Never  Vaping Use   Vaping Use: Never used  Substance Use Topics   Alcohol use: Yes   Drug use: Never    Home Medications Prior to Admission medications   Not on File    Allergies    Patient has no known allergies.  Review of Systems   Review of Systems  All other systems reviewed and are negative.  Physical Exam Updated Vital Signs BP (!) 148/55 (BP Location: Right Arm)    Pulse 81    Temp (!) 97.5 F (36.4 C) (Oral)    Resp 18    Ht 1.854 m (6\' 1" )    Wt 61.2 kg    SpO2 100%    BMI 17.81 kg/m   Physical Exam Vitals and nursing note reviewed.  Constitutional:      General: He is not in acute distress.    Appearance: Normal appearance. He is not toxic-appearing.  HENT:     Head:  Normocephalic and atraumatic.  Eyes:     General: Lids are normal.     Conjunctiva/sclera: Conjunctivae normal.     Pupils: Pupils are equal, round, and reactive to light.  Neck:     Thyroid: No thyroid mass.     Trachea: No tracheal deviation.  Cardiovascular:     Rate and Rhythm: Normal rate and regular rhythm.     Heart sounds: Normal heart sounds. No murmur heard.   No gallop.  Pulmonary:     Effort: Pulmonary effort is normal. No respiratory distress.     Breath sounds: No stridor. Examination of the right-lower field reveals decreased breath sounds. Examination of the left-lower field reveals decreased breath sounds. Decreased breath sounds present. No wheezing, rhonchi or rales.  Abdominal:     General: There is no distension.     Palpations: Abdomen is soft.     Tenderness: There is no abdominal tenderness. There is no rebound.  Musculoskeletal:        General: No tenderness. Normal range of motion.     Cervical back: Normal range of motion and neck supple.  Skin:    General: Skin is warm and  dry.     Findings: No abrasion or rash.  Neurological:     Mental Status: He is alert and oriented to person, place, and time. Mental status is at baseline.     GCS: GCS eye subscore is 4. GCS verbal subscore is 5. GCS motor subscore is 6.     Cranial Nerves: No cranial nerve deficit.     Sensory: No sensory deficit.     Motor: Motor function is intact.  Psychiatric:        Attention and Perception: Attention normal.        Speech: Speech normal.        Behavior: Behavior normal.    ED Results / Procedures / Treatments   Labs (all labs ordered are listed, but only abnormal results are displayed) Labs Reviewed  RESP PANEL BY RT-PCR (FLU A&B, COVID) ARPGX2  CBC WITH DIFFERENTIAL/PLATELET  COMPREHENSIVE METABOLIC PANEL  BRAIN NATRIURETIC PEPTIDE    EKG EKG Interpretation  Date/Time:  Monday September 21 2021 10:02:05 EST Ventricular Rate:  72 PR Interval:  187 QRS  Duration: 83 QT Interval:  403 QTC Calculation: 441 R Axis:   85 Text Interpretation: Sinus rhythm Anterior infarct, old Confirmed by Lacretia Leigh ([id number]) on 09/21/2021 11:49:49 AM  Radiology No results found.  Procedures Procedures   Medications Ordered in ED Medications  albuterol (VENTOLIN HFA) 108 (90 Base) MCG/ACT inhaler 2 puff (has no administration in time range)  0.9 %  sodium chloride infusion (has no administration in time range)    ED Course  I have reviewed the triage vital signs and the nursing notes.  Pertinent labs & imaging results that were available during my care of the patient were reviewed by me and considered in my medical decision making (see chart for details).    MDM Rules/Calculators/A&P    Patient is a poor effusions here with elevated BNP concerning for CHF.  Also has metastatic disease noted on patient's chest x-ray.  Will give Lasix here and patient will require admission                        Final Clinical Impression(s) / ED Diagnoses Final diagnoses:  None    Rx / DC Orders ED Discharge Orders     None        Lacretia Leigh, MD 09/21/21 1158

## 2021-09-21 NOTE — ED Notes (Signed)
ED TO INPATIENT HANDOFF REPORT  ED Nurse Name and Phone #: Salvatore Decent Name/Age/Gender Brian Cameron 75 y.o. male Room/Bed: WA11/WA11  Code Status   Code Status: Full Code  Home/SNF/Other Home Patient oriented to: self, place, and time Is this baseline? Yes   Triage Complete: Triage complete  Chief Complaint Bilateral pleural effusion [J90]  Triage Note Patient was [facility] on [facility]21 with c/o SOB, cough weight loss. Patient was instructed to go to the ED for further evaluation for concern pleural effusion, skeletal metastases, etc.   Allergies No Known Allergies  Level of Care/Admitting Diagnosis ED Disposition     ED Disposition  Admit   Condition  --   [place]: [place] [[id number]]  Level of Care: Telemetry [5]  Admit to tele based on following criteria: Monitor for Ischemic changes  May place patient in observation at [facility] or [place] if equivalent level of care is available:: No  Covid Evaluation: Asymptomatic Screening Protocol (No Symptoms)  Diagnosis: Bilateral pleural effusion [182993]  Admitting Physician: Reubin Milan [[id number]]  Attending Physician: Reubin Milan [[id number]]          B Medical/Surgery History History reviewed. No pertinent past medical history. History reviewed. No pertinent surgical history.   A IV Location/Drains/Wounds Patient Lines/Drains/Airways Status     Active Line/Drains/Airways     Name Placement date Placement time Site Days   Peripheral IV 09/21/21 20 G Anterior;Left Forearm 09/21/21  1030  Forearm  less than 1   Wound 01/14/13 Abrasion(s) Face Left 01/14/13  1337  Face  3172            Intake/Output Last 24 hours No intake or output data in the 24 hours ending 09/21/21 1433  Labs/Imaging Results for orders placed or performed during the hospital encounter of 09/21/21 (from the past 48 hour(s))  CBC with Differential/Platelet     Status:  Abnormal   Collection Time: 09/21/21 10:30 AM  Result Value Ref Range   WBC 8.1 4.0 - 10.5 K/uL   RBC 2.41 (L) 4.22 - 5.81 MIL/uL   Hemoglobin 7.7 (L) 13.0 - 17.0 g/dL   HCT 26.2 (L) 39.0 - 52.0 %   MCV 108.7 (H) 80.0 - 100.0 fL   MCH 32.0 26.0 - 34.0 pg   MCHC 29.4 (L) 30.0 - 36.0 g/dL   RDW 19.7 (H) 11.5 - 15.5 %   Platelets 52 (L) 150 - 400 K/uL    Comment: SPECIMEN CHECKED FOR CLOTS Immature Platelet Fraction may be clinically indicated, consider ordering this additional test [id number] PLATELET COUNT CONFIRMED BY SMEAR    nRBC 12.3 (H) 0.0 - 0.2 %   Neutrophils Relative % 31 %   Neutro Abs 2.5 1.7 - 7.7 K/uL   Lymphocytes Relative 45 %   Lymphs Abs 3.6 0.7 - 4.0 K/uL   Monocytes Relative 14 %   Monocytes Absolute 1.1 (H) 0.1 - 1.0 K/uL   Eosinophils Relative 1 %   Eosinophils Absolute 0.1 0.0 - 0.5 K/uL   Basophils Relative 1 %   Basophils Absolute 0.1 0.0 - 0.1 K/uL   Immature Granulocytes 8 %   Abs Immature Granulocytes 0.68 (H) 0.00 - 0.07 K/uL   Reactive, Benign Lymphocytes PRESENT    Polychromasia PRESENT     Comment: Performed at [facility], [place] [address], [place], [place] [address]  Comprehensive metabolic panel     Status: Abnormal  Collection Time: 09/21/21 10:30 AM  Result Value Ref Range   Sodium 140 135 - 145 mmol/L   Potassium 5.4 (H) 3.5 - 5.1 mmol/L   Chloride 117 (H) 98 - 111 mmol/L   CO2 17 (L) 22 - 32 mmol/L   Glucose, Bld 97 70 - 99 mg/dL    Comment: Glucose reference range applies only to samples taken after fasting for at least 8 hours.   BUN 29 (H) 8 - 23 mg/dL   Creatinine, Ser 1.37 (H) 0.61 - 1.24 mg/dL   Calcium 7.9 (L) 8.9 - 10.3 mg/dL   Total Protein 5.9 (L) 6.5 - 8.1 g/dL   Albumin 3.0 (L) 3.5 - 5.0 g/dL   AST 40 15 - 41 U/L   ALT 21 0 - 44 U/L   Alkaline Phosphatase 683 (H) 38 - 126 U/L   Total Bilirubin 0.3 0.3 - 1.2 mg/dL   GFR, Estimated 53 (L) >60 mL/min    Comment: (NOTE) Calculated using the CKD-EPI  Creatinine Equation (2021)    Anion gap 6 5 - 15    Comment: Performed at [facility], [place] [address] [place], [place] [address]  Brain natriuretic peptide     Status: Abnormal   Collection Time: 09/21/21 10:30 AM  Result Value Ref Range   B Natriuretic Peptide 474.5 (H) 0.0 - 100.0 pg/mL    Comment: Performed at [facility], [place] [address] [place], [place] [address]  Vitamin B12     Status: None   Collection Time: 09/21/21 10:30 AM  Result Value Ref Range   Vitamin B-12 418 180 - 914 pg/mL    Comment: (NOTE) This assay is not validated for testing neonatal or myeloproliferative syndrome specimens for Vitamin B12 levels. Performed at [facility], [place] [address], [place], [place] [address]   Folate     Status: None   Collection Time: 09/21/21 10:30 AM  Result Value Ref Range   Folate 6.7 >5.9 ng/mL    Comment: Performed at [facility], [place] [address], [place], [place] [address]  Ferritin     Status: None   Collection Time: 09/21/21 10:30 AM  Result Value Ref Range   Ferritin 89 24 - 336 ng/mL    Comment: Performed at [facility], [place] [address] [place], [place] [address]  Iron and TIBC     Status: None   Collection Time: 09/21/21 10:30 AM  Result Value Ref Range   Iron 115 45 - 182 ug/dL   TIBC 372 250 - 450 ug/dL   Saturation Ratios 31 17.9 - 39.5 %   UIBC 257 ug/dL    Comment: Performed at [facility], [place] [address] [place], [place] [address]  Resp Panel by RT-PCR (Flu A&B, Covid) Nasopharyngeal Swab     Status: None   Collection Time: 09/21/21 11:18 AM   Specimen: Nasopharyngeal Swab; Nasopharyngeal(NP) swabs in vial transport medium  Result Value Ref Range   SARS Coronavirus 2 by RT PCR NEGATIVE NEGATIVE    Comment: (NOTE) SARS-CoV-2 target nucleic acids are NOT DETECTED.  The SARS-CoV-2 RNA is generally detectable in upper respiratory specimens  during the acute phase of infection. The lowest concentration of SARS-CoV-2 viral copies this assay can detect is 138 copies/mL. A negative result does not preclude SARS-Cov-2 infection and should not be used as the sole basis for treatment or other patient management decisions. A negative result may occur with  improper specimen  collection/handling, submission of specimen other than nasopharyngeal swab, presence of viral mutation(s) within the areas targeted by this assay, and inadequate number of viral copies(<138 copies/mL). A negative result must be combined with clinical observations, patient history, and epidemiological information. The expected result is Negative.  Fact Sheet for Patients:  EntrepreneurPulse.com.au  Fact Sheet for Healthcare Providers:  IncredibleEmployment.be  This test is no t yet approved or cleared by the [place] FDA and  has been authorized for detection and/or diagnosis of SARS-CoV-2 by FDA under an Emergency Use Authorization (EUA). This EUA will remain  in effect (meaning this test can be used) for the duration of the COVID-19 declaration under Section 564(b)(1) of the Act, 21 U.S.C.section 360bbb-3(b)(1), unless the authorization is terminated  or revoked sooner.       Influenza A by PCR NEGATIVE NEGATIVE   Influenza B by PCR NEGATIVE NEGATIVE    Comment: (NOTE) The Xpert Xpress SARS-CoV-2/FLU/RSV plus assay is intended as an aid in the diagnosis of influenza from Nasopharyngeal swab specimens and should not be used as a sole basis for treatment. Nasal washings and aspirates are unacceptable for Xpert Xpress SARS-CoV-2/FLU/RSV testing.  Fact Sheet for Patients: EntrepreneurPulse.com.au  Fact Sheet for Healthcare Providers: IncredibleEmployment.be  This test is not yet approved or cleared by the [place] FDA and has been authorized for detection and/or diagnosis  of SARS-CoV-2 by FDA under an Emergency Use Authorization (EUA). This EUA will remain in effect (meaning this test can be used) for the duration of the COVID-19 declaration under Section 564(b)(1) of the Act, 21 U.S.C. section 360bbb-3(b)(1), unless the authorization is terminated or revoked.  Performed at [facility], [place] [address] [place], [place] [address]   POC occult blood, ED RN will collect     Status: Abnormal   Collection Time: 09/21/21 12:46 PM  Result Value Ref Range   Fecal Occult Bld POSITIVE (A) NEGATIVE   DG Chest 2 View  Result Date: 09/21/2021 CLINICAL DATA:  Shortness of breath, cough, weight loss EXAM: CHEST - 2 VIEW COMPARISON:  09/21/2021 FINDINGS: Normal heart size, mediastinal contours, and pulmonary vascularity. Atherosclerotic calcification aorta. Bibasilar effusions and atelectasis. Remaining lungs clear. No pneumothorax. Diffuse osteosclerosis, patchy, highly suspicious for osseous metastatic disease. Biconvex thoracic scoliosis. IMPRESSION: Bibasilar pleural effusions and atelectasis. Diffuse patchy osteosclerosis, highly suspicious for osseous metastatic disease. Electronically Signed   By: Lavonia Dana M.D.   On: 09/21/2021 10:52    Pending Labs Unresulted Labs (From admission, onward)     Start     Ordered   09/22/21 0500  CBC  Tomorrow morning,   R        09/21/21 1222   09/22/21 0500  Comprehensive metabolic panel  Tomorrow morning,   R        09/21/21 1222   09/21/21 1241  Reticulocytes  Add-on,   AD        09/21/21 1240            Vitals/Pain Today's Vitals   09/21/21 1300 09/21/21 1330 09/21/21 1429 09/21/21 1431  BP: (!) 160/64 (!) 147/60 (!) 146/57   Pulse: 85 76 75   Resp:  18 18   Temp:   (!) 97.5 F (36.4 C)   TempSrc:   Oral   SpO2: 100% 100% 100%   Weight:      Height:      PainSc:   0-No pain 0-No pain    Isolation Precautions No active isolations  Medications  Medications  0.9 %  sodium chloride  infusion ( Intravenous New Bag/Given 09/21/21 1032)  acetaminophen (TYLENOL) tablet 650 mg (has no administration in time range)    Or  acetaminophen (TYLENOL) suppository 650 mg (has no administration in time range)  ondansetron (ZOFRAN) tablet 4 mg (has no administration in time range)    Or  ondansetron (ZOFRAN) injection 4 mg (has no administration in time range)  albuterol (PROVENTIL) (2.5 MG/3ML) 0.083% nebulizer solution 2.5 mg (has no administration in time range)  nicotine (NICODERM CQ - dosed in mg/24 hours) patch 21 mg (has no administration in time range)  furosemide (LASIX) injection 40 mg (40 mg Intravenous Given 09/21/21 1206)    Mobility walks Low fall risk      R Recommendations: See Admitting Provider Note  Report given to:   Additional Notes:

## 2021-09-21 NOTE — Progress Notes (Incomplete)
2D echo attempted, but patient stated that I could not do it now, but after he is finished eating. Will try echo at a later time, or possibly tomorrow.

## 2021-09-21 NOTE — Progress Notes (Signed)
°  Echocardiogram 2D Echocardiogram has been performed.  Merrie Roof F 09/21/2021, 4:17 PM

## 2021-09-21 NOTE — Plan of Care (Signed)
  Problem: Education: Goal: Knowledge of General Education information will improve Description: Including pain rating scale, medication(s)/side effects and non-pharmacologic comfort measures Outcome: Progressing   Problem: Health Behavior/Discharge Planning: Goal: Ability to manage health-related needs will improve Outcome: Progressing   Problem: Safety: Goal: Ability to remain free from injury will improve Outcome: Progressing   

## 2021-09-21 NOTE — ED Triage Notes (Signed)
Patient was seen at [facility] on [facility]/21 with c/o SOB, cough weight loss. Patient was instructed to go to the ED for further evaluation for concern pleural effusion, skeletal metastases, etc.

## 2021-09-21 NOTE — Progress Notes (Signed)
Patient refusing to drink contrast for scan. Attempted several times to convince patient/educate him on the need/importance of scan. Patient still refused. Informing physician and CT.

## 2021-09-22 ENCOUNTER — Observation Stay (HOSPITAL_COMMUNITY): Payer: Medicare Other

## 2021-09-22 DIAGNOSIS — R64 Cachexia: Secondary | ICD-10-CM | POA: Diagnosis present

## 2021-09-22 DIAGNOSIS — D6959 Other secondary thrombocytopenia: Secondary | ICD-10-CM | POA: Diagnosis present

## 2021-09-22 DIAGNOSIS — E875 Hyperkalemia: Secondary | ICD-10-CM | POA: Diagnosis present

## 2021-09-22 DIAGNOSIS — E43 Unspecified severe protein-calorie malnutrition: Secondary | ICD-10-CM | POA: Diagnosis present

## 2021-09-22 DIAGNOSIS — Z8249 Family history of ischemic heart disease and other diseases of the circulatory system: Secondary | ICD-10-CM | POA: Diagnosis not present

## 2021-09-22 DIAGNOSIS — C61 Malignant neoplasm of prostate: Secondary | ICD-10-CM | POA: Diagnosis present

## 2021-09-22 DIAGNOSIS — J90 Pleural effusion, not elsewhere classified: Secondary | ICD-10-CM | POA: Diagnosis not present

## 2021-09-22 DIAGNOSIS — J910 Malignant pleural effusion: Secondary | ICD-10-CM | POA: Diagnosis present

## 2021-09-22 DIAGNOSIS — C7951 Secondary malignant neoplasm of bone: Secondary | ICD-10-CM | POA: Diagnosis present

## 2021-09-22 DIAGNOSIS — E8809 Other disorders of plasma-protein metabolism, not elsewhere classified: Secondary | ICD-10-CM | POA: Diagnosis present

## 2021-09-22 DIAGNOSIS — N400 Enlarged prostate without lower urinary tract symptoms: Secondary | ICD-10-CM | POA: Diagnosis present

## 2021-09-22 DIAGNOSIS — F1011 Alcohol abuse, in remission: Secondary | ICD-10-CM | POA: Diagnosis present

## 2021-09-22 DIAGNOSIS — Z801 Family history of malignant neoplasm of trachea, bronchus and lung: Secondary | ICD-10-CM | POA: Diagnosis not present

## 2021-09-22 DIAGNOSIS — R195 Other fecal abnormalities: Secondary | ICD-10-CM | POA: Diagnosis not present

## 2021-09-22 DIAGNOSIS — F17210 Nicotine dependence, cigarettes, uncomplicated: Secondary | ICD-10-CM | POA: Diagnosis present

## 2021-09-22 DIAGNOSIS — Z66 Do not resuscitate: Secondary | ICD-10-CM | POA: Diagnosis not present

## 2021-09-22 DIAGNOSIS — N1330 Unspecified hydronephrosis: Secondary | ICD-10-CM | POA: Diagnosis present

## 2021-09-22 DIAGNOSIS — J9811 Atelectasis: Secondary | ICD-10-CM | POA: Diagnosis present

## 2021-09-22 DIAGNOSIS — N179 Acute kidney failure, unspecified: Secondary | ICD-10-CM

## 2021-09-22 DIAGNOSIS — D539 Nutritional anemia, unspecified: Secondary | ICD-10-CM | POA: Diagnosis present

## 2021-09-22 DIAGNOSIS — Z681 Body mass index (BMI) 19 or less, adult: Secondary | ICD-10-CM | POA: Diagnosis not present

## 2021-09-22 DIAGNOSIS — Q782 Osteopetrosis: Secondary | ICD-10-CM | POA: Diagnosis not present

## 2021-09-22 DIAGNOSIS — R0902 Hypoxemia: Secondary | ICD-10-CM | POA: Diagnosis present

## 2021-09-22 DIAGNOSIS — R0602 Shortness of breath: Secondary | ICD-10-CM | POA: Diagnosis present

## 2021-09-22 DIAGNOSIS — E872 Acidosis, unspecified: Secondary | ICD-10-CM | POA: Diagnosis present

## 2021-09-22 DIAGNOSIS — C799 Secondary malignant neoplasm of unspecified site: Secondary | ICD-10-CM

## 2021-09-22 DIAGNOSIS — Z20822 Contact with and (suspected) exposure to covid-19: Secondary | ICD-10-CM | POA: Diagnosis present

## 2021-09-22 LAB — CBC
HCT: 22.8 % — ABNORMAL LOW (ref 39.0–52.0)
Hemoglobin: 7 g/dL — ABNORMAL LOW (ref 13.0–17.0)
MCH: 32.6 pg (ref 26.0–34.0)
MCHC: 30.7 g/dL (ref 30.0–36.0)
MCV: 106 fL — ABNORMAL HIGH (ref 80.0–100.0)
Platelets: 50 10*3/uL — ABNORMAL LOW (ref 150–400)
RBC: 2.15 MIL/uL — ABNORMAL LOW (ref 4.22–5.81)
RDW: 19.5 % — ABNORMAL HIGH (ref 11.5–15.5)
WBC: 7.5 10*3/uL (ref 4.0–10.5)
nRBC: 11.6 % — ABNORMAL HIGH (ref 0.0–0.2)

## 2021-09-22 LAB — COMPREHENSIVE METABOLIC PANEL
ALT: 19 U/L (ref 0–44)
AST: 37 U/L (ref 15–41)
Albumin: 2.9 g/dL — ABNORMAL LOW (ref 3.5–5.0)
Alkaline Phosphatase: 693 U/L — ABNORMAL HIGH (ref 38–126)
Anion gap: 6 (ref 5–15)
BUN: 33 mg/dL — ABNORMAL HIGH (ref 8–23)
CO2: 20 mmol/L — ABNORMAL LOW (ref 22–32)
Calcium: 7.9 mg/dL — ABNORMAL LOW (ref 8.9–10.3)
Chloride: 113 mmol/L — ABNORMAL HIGH (ref 98–111)
Creatinine, Ser: 1.44 mg/dL — ABNORMAL HIGH (ref 0.61–1.24)
GFR, Estimated: 50 mL/min — ABNORMAL LOW (ref >60)
Glucose, Bld: 96 mg/dL (ref 70–99)
Potassium: 5.4 mmol/L — ABNORMAL HIGH (ref 3.5–5.1)
Sodium: 139 mmol/L (ref 135–145)
Total Bilirubin: 0.5 mg/dL (ref 0.3–1.2)
Total Protein: 5.9 g/dL — ABNORMAL LOW (ref 6.5–8.1)

## 2021-09-22 LAB — ABO/RH: ABO/RH(D): O POS

## 2021-09-22 LAB — PSA: Prostatic Specific Antigen: 975.26 ng/mL — ABNORMAL HIGH (ref 0.00–4.00)

## 2021-09-22 MED ORDER — SODIUM ZIRCONIUM CYCLOSILICATE 10 G PO PACK
10.0000 g | PACK | Freq: Once | ORAL | Status: AC
  Administered 2021-09-23: 10 g via ORAL
  Filled 2021-09-22 (×2): qty 1

## 2021-09-22 NOTE — Assessment & Plan Note (Signed)
Urology c/s, 2/2 metastatic disease Appreciate assistance

## 2021-09-22 NOTE — Assessment & Plan Note (Addendum)
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

## 2021-09-22 NOTE — Progress Notes (Signed)
PROGRESS NOTE    Brian Cameron  VZC:[id number] DOB: 1946/07/15 DOA: 09/21/2021 PCP: Pcp, No  Chief Complaint  Patient presents with   Shortness of Breath   Cough    Brief Narrative:  75 yo with hx etoh abuse and tobacco abuse, but limited medical care, who presented to Urgent Care 1 week ago with SOB and was directed to the ED with an abnormal x ray.  He presented with progressive SOB.  Notes 40 lbs weight loss over past few months.  Generalized weakness. Imaging is concerning for metastatic prostate cancer.  Plan is for thoracentesis, urology and oncology c/s.  See below for additional recs    Assessment & Plan:   Principal Problem:   Bilateral pleural effusion Active Problems:   Metastatic cancer (HCC)   Hyperkalemia   AKI (acute kidney injury) ([place])   Bilateral hydronephrosis   Hypoalbuminemia   Macrocytic anemia   Positive fecal occult blood test   Thrombocytopenia (HCC)   * Bilateral pleural effusion- (present on admission) Pending Follow cytology and pleural fluid  Metastatic cancer ([place]) Not biopsy confirmed, imaging and elevated PSA concerning for metastatic prostate cancer PSA >900 CT with diffuse bony sclerosis concerning for widespread sclerotic metastatic disease, marked enlargement of prostate, R middle lobe pulm nodule, bilateral hydro, bilateral pleural effusions Follow thoracentesis, will send for cytology C/s urology, appreciate assistance - will need bx Will need oncology involvement as well  Bilateral hydronephrosis Urology c/s, 2/2 metastatic disease Appreciate assistance  AKI (acute kidney injury) ([place]) Creatinine ~1.4, mild hyperkalemia Unclear baseline, possibly chronic Vs acute related to hydro Appreciate urology assistance Follow UA  Hyperkalemia- (present on admission) Mild, lokelma x1, follow  Macrocytic anemia- (present on admission) B12, folate, iron, ferritin Type and screen Transfuse for <7  Positive fecal occult blood  test- (present on admission) No gross bloody stools Suspect related to severe thrombocytopenia Consider GI c/s if evidence of blood in stool  Thrombocytopenia ([place])- (present on admission) Likely related to malignancy     DVT prophylaxis: SCD Code Status: full Family Communication: none Disposition:   Status is: Inpatient  Remains inpatient appropriate because: need for IR thora, urology, oncology c/s       Consultants:  urology  Procedures:  Echo IMPRESSIONS     1. Left ventricular ejection fraction, by estimation, is 55 to 60%. The  left ventricle has normal function. The left ventricle has no regional  wall motion abnormalities. There is mild concentric left ventricular  hypertrophy. Left ventricular diastolic  function could not be evaluated.   2. Right ventricular systolic function is normal. The right ventricular  size is normal. There is mildly elevated pulmonary artery systolic  pressure.   3. Left atrial size was mildly dilated.   4. The mitral valve is normal in structure. Trivial mitral valve  regurgitation.   5. Tricuspid valve regurgitation is mild to moderate.   6. The aortic valve is tricuspid. There is mild calcification of the  aortic valve. There is mild thickening of the aortic valve. Aortic valve  regurgitation is mild to moderate. Aortic valve sclerosis is present, with  no evidence of aortic valve  stenosis.   Antimicrobials:  Anti-infectives (From admission, onward)    None       Subjective: Asking to eat - not sure if he's sob, hasn't been up  Objective: Vitals:   09/21/21 1938 09/22/21 0022 09/22/21 0354 09/22/21 1350  BP: (!) 145/62 (!) 142/57 132/71 (!) 136/57  Pulse: 74 72 73  72  Resp: 20 16 18  (!) 23  Temp: 97.6 F (36.4 C) 97.7 F (36.5 C) 97.8 F (36.6 C) 97.7 F (36.5 C)  TempSrc: Oral Oral Oral Oral  SpO2: 100% 98% 98% 98%  Weight:      Height:       No intake or output data in the 24 hours ending 09/22/21  1957 Filed Weights   09/21/21 0947  Weight: 61.2 kg    Examination:  General exam: Appears calm and comfortable  Respiratory system: unlabored Cardiovascular system: RRR Gastrointestinal system: Abdomen is nondistended, soft and nontender Central nervous system: Alert and oriented. No focal neurological deficits. Extremities: no LEE Skin: No rashes, lesions or ulcers Psychiatry: Judgement and insight appear normal. Mood & affect appropriate.     Data Reviewed: I have personally reviewed following labs and imaging studies  CBC: Recent Labs  Lab 09/21/21 1030 09/21/21 1826 09/22/21 0523  WBC 8.1  --  7.5  NEUTROABS 2.5  --   --   HGB 7.7* 7.0* 7.0*  HCT 26.2* 23.7* 22.8*  MCV 108.7*  --  106.0*  PLT 52*  --  50*    Basic Metabolic Panel: Recent Labs  Lab 09/21/21 1030 09/21/21 1826 09/22/21 0523  NA 140 140 139  K 5.4* 5.4* 5.4*  CL 117* 113* 113*  CO2 17* 20* 20*  GLUCOSE 97 98 96  BUN 29* 35* 33*  CREATININE 1.37* 1.43* 1.44*  CALCIUM 7.9* 7.9* 7.9*    GFR: Estimated Creatinine Clearance: 37.2 mL/min (Brian Cameron) (by C-G formula based on SCr of 1.44 mg/dL (H)).  Liver Function Tests: Recent Labs  Lab 09/21/21 1030 09/22/21 0523  AST 40 37  ALT 21 19  ALKPHOS 683* 693*  BILITOT 0.3 0.5  PROT 5.9* 5.9*  ALBUMIN 3.0* 2.9*    CBG: No results for input(s): GLUCAP in the last 168 hours.   Recent Results (from the past 240 hour(s))  Resp Panel by RT-PCR (Brian Cameron&B, Covid) Nasopharyngeal Swab     Status: None   Collection Time: 09/21/21 11:18 AM   Specimen: Nasopharyngeal Swab; Nasopharyngeal(NP) swabs in vial transport medium  Result Value Ref Range Status   SARS Coronavirus 2 by RT PCR NEGATIVE NEGATIVE Final    Comment: (NOTE) SARS-CoV-2 target nucleic acids are NOT DETECTED.  The SARS-CoV-2 RNA is generally detectable in upper respiratory specimens during the acute phase of infection. The lowest concentration of SARS-CoV-2 viral copies this assay can  detect is 138 copies/mL. Brian Cameron negative result does not preclude SARS-Cov-2 infection and should not be used as the sole basis for treatment or other patient management decisions. Brian Cameron negative result may occur with  improper specimen collection/handling, submission of specimen other than nasopharyngeal swab, presence of viral mutation(s) within the areas targeted by this assay, and inadequate number of viral copies(<138 copies/mL). Caydn Justen negative result must be combined with clinical observations, patient history, and epidemiological information. The expected result is Negative.  Fact Sheet for Patients:  EntrepreneurPulse.com.au  Fact Sheet for Healthcare Providers:  IncredibleEmployment.be  This test is no t yet approved or cleared by the [place] FDA and  has been authorized for detection and/or diagnosis of SARS-CoV-2 by FDA under an Emergency Use Authorization (EUA). This EUA will remain  in effect (meaning this test can be used) for the duration of the COVID-19 declaration under Section 564(b)(1) of the Act, 21 U.S.C.section 360bbb-3(b)(1), unless the authorization is terminated  or revoked sooner.       Influenza Marion Seese  by PCR NEGATIVE NEGATIVE Final   Influenza B by PCR NEGATIVE NEGATIVE Final    Comment: (NOTE) The Xpert Xpress SARS-CoV-2/Brian/RSV plus assay is intended as an aid in the diagnosis of influenza from Nasopharyngeal swab specimens and should not be used as Cohan Stipes sole basis for treatment. Nasal washings and aspirates are unacceptable for Xpert Xpress SARS-CoV-2/Brian/RSV testing.  Fact Sheet for Patients: EntrepreneurPulse.com.au  Fact Sheet for Healthcare Providers: IncredibleEmployment.be  This test is not yet approved or cleared by the [place] FDA and has been authorized for detection and/or diagnosis of SARS-CoV-2 by FDA under an Emergency Use Authorization (EUA). This EUA will remain in  effect (meaning this test can be used) for the duration of the COVID-19 declaration under Section 564(b)(1) of the Act, 21 U.S.C. section 360bbb-3(b)(1), unless the authorization is terminated or revoked.  Performed at [facility], [place] [address], [place], [place] [address]          Radiology Studies: DG Chest 2 View  Result Date: 09/21/2021 CLINICAL DATA:  Shortness of breath, cough, weight loss EXAM: CHEST - 2 VIEW COMPARISON:  09/21/2021 FINDINGS: Normal heart size, mediastinal contours, and pulmonary vascularity. Atherosclerotic calcification aorta. Bibasilar effusions and atelectasis. Remaining lungs clear. No pneumothorax. Diffuse osteosclerosis, patchy, highly suspicious for osseous metastatic disease. Biconvex thoracic scoliosis. IMPRESSION: Bibasilar pleural effusions and atelectasis. Diffuse patchy osteosclerosis, highly suspicious for osseous metastatic disease. Electronically Signed   By: Lavonia Dana M.D.   On: 09/21/2021 10:52   CT CHEST W CONTRAST  Result Date: 09/21/2021 CLINICAL DATA:  Short of breath, cough, weight loss, suspected bony metastases on chest x-ray EXAM: CT CHEST, ABDOMEN, AND PELVIS WITH CONTRAST TECHNIQUE: Multidetector CT imaging of the chest, abdomen and pelvis was performed following the standard protocol during bolus administration of intravenous contrast. CONTRAST:  68mL OMNIPAQUE IOHEXOL 350 MG/ML SOLN COMPARISON:  09/21/2021 FINDINGS: CT CHEST FINDINGS Cardiovascular: The heart and great vessels are unremarkable without pericardial effusion. No evidence of thoracic aortic aneurysm or dissection. Atherosclerosis of the aorta and coronary vasculature. Mediastinum/Nodes: No enlarged mediastinal, hilar, or axillary lymph nodes. Thyroid gland, trachea, and esophagus demonstrate no significant findings. Lungs/Pleura: Moderate bilateral pleural effusions estimated volume in excess of 1 L each. There is dependent consolidation within the lower  lobes compatible with compressive atelectasis. No acute airspace disease. No pneumothorax. Mild upper lobe predominant emphysema. Central airways are patent. Indeterminate 9 x 7 x 8 mm right middle lobe pulmonary nodule, reference image 148/5. Musculoskeletal: The bones are diffusely sclerotic, consistent with diffuse bony metastases. Chronic appearing right lateral eleventh rib fracture. No other displaced fractures. CT ABDOMEN PELVIS FINDINGS Hepatobiliary: No focal liver abnormality is seen. No gallstones, gallbladder wall thickening, or biliary dilatation. Pancreas: Unremarkable. No pancreatic ductal dilatation or surrounding inflammatory changes. Spleen: Normal in size without focal abnormality. Adrenals/Urinary Tract: Mild bilateral renal cortical atrophy, right greater than left. There is bilateral obstructive uropathy, as well as marked distension of the urinary bladder with multiple trabeculations. Findings are most consistent with chronic bladder outlet obstruction. Dependent calcifications within the bladder consistent with small cysts to let us. Largest measures 4 mm. Nonspecific mural calcification along and anterior trabeculation, measuring 7 mm reference image 120/3. Adrenals appear unremarkable. Stomach/Bowel: No bowel obstruction or ileus. No bowel wall thickening or inflammatory changes. Vascular/Lymphatic: Extensive atherosclerosis throughout the aorta and its branches. No pathologic adenopathy within the abdomen or pelvis. Reproductive: The prostate is enlarged and somewhat heterogeneous, measuring approximately 5.4 x 5.1 by 5.1 cm. Other: No  free fluid or free intraperitoneal gas. No abdominal wall hernia. Musculoskeletal: Visualized bony structures are diffusely sclerotic, consistent with diffuse metastatic disease. There are no pathologic fractures. Bilateral hip osteoarthritis, right greater than left. Reconstructed images demonstrate no additional findings. IMPRESSION: 1. Diffuse bony  sclerosis throughout the axial and visualized appendicular skeleton, consistent with widespread sclerotic metastatic disease. In Yovana Scogin patient of this age, prostate cancer would be the diagnosis of exclusion. 2. Marked enlargement of the prostate. Please correlate with serum PSA for evidence of underlying prostate cancer. 3. Nonspecific right middle lobe pulmonary nodule with mean diameter of 8 mm. Primary malignancy or metastatic disease is suspected given associated bony findings. 4. Bilateral hydronephrosis, hydroureter, and distended urinary bladder with multiple trabeculations, consistent with chronic bladder outlet obstruction. Nonobstructing bladder calculi and nonspecific bladder mural calcifications as above. 5. Moderate bilateral pleural effusions and compressive lower lobe atelectasis. 6. Aortic Atherosclerosis (ICD10-I70.0) and Emphysema (ICD10-J43.9). Electronically Signed   By: Randa Ngo M.D.   On: 09/21/2021 19:15   CT ABDOMEN PELVIS W CONTRAST  Result Date: 09/21/2021 CLINICAL DATA:  Short of breath, cough, weight loss, suspected bony metastases on chest x-ray EXAM: CT CHEST, ABDOMEN, AND PELVIS WITH CONTRAST TECHNIQUE: Multidetector CT imaging of the chest, abdomen and pelvis was performed following the standard protocol during bolus administration of intravenous contrast. CONTRAST:  106mL OMNIPAQUE IOHEXOL 350 MG/ML SOLN COMPARISON:  09/21/2021 FINDINGS: CT CHEST FINDINGS Cardiovascular: The heart and great vessels are unremarkable without pericardial effusion. No evidence of thoracic aortic aneurysm or dissection. Atherosclerosis of the aorta and coronary vasculature. Mediastinum/Nodes: No enlarged mediastinal, hilar, or axillary lymph nodes. Thyroid gland, trachea, and esophagus demonstrate no significant findings. Lungs/Pleura: Moderate bilateral pleural effusions estimated volume in excess of 1 L each. There is dependent consolidation within the lower lobes compatible with compressive  atelectasis. No acute airspace disease. No pneumothorax. Mild upper lobe predominant emphysema. Central airways are patent. Indeterminate 9 x 7 x 8 mm right middle lobe pulmonary nodule, reference image 148/5. Musculoskeletal: The bones are diffusely sclerotic, consistent with diffuse bony metastases. Chronic appearing right lateral eleventh rib fracture. No other displaced fractures. CT ABDOMEN PELVIS FINDINGS Hepatobiliary: No focal liver abnormality is seen. No gallstones, gallbladder wall thickening, or biliary dilatation. Pancreas: Unremarkable. No pancreatic ductal dilatation or surrounding inflammatory changes. Spleen: Normal in size without focal abnormality. Adrenals/Urinary Tract: Mild bilateral renal cortical atrophy, right greater than left. There is bilateral obstructive uropathy, as well as marked distension of the urinary bladder with multiple trabeculations. Findings are most consistent with chronic bladder outlet obstruction. Dependent calcifications within the bladder consistent with small cysts to let us. Largest measures 4 mm. Nonspecific mural calcification along and anterior trabeculation, measuring 7 mm reference image 120/3. Adrenals appear unremarkable. Stomach/Bowel: No bowel obstruction or ileus. No bowel wall thickening or inflammatory changes. Vascular/Lymphatic: Extensive atherosclerosis throughout the aorta and its branches. No pathologic adenopathy within the abdomen or pelvis. Reproductive: The prostate is enlarged and somewhat heterogeneous, measuring approximately 5.4 x 5.1 by 5.1 cm. Other: No free fluid or free intraperitoneal gas. No abdominal wall hernia. Musculoskeletal: Visualized bony structures are diffusely sclerotic, consistent with diffuse metastatic disease. There are no pathologic fractures. Bilateral hip osteoarthritis, right greater than left. Reconstructed images demonstrate no additional findings. IMPRESSION: 1. Diffuse bony sclerosis throughout the axial and  visualized appendicular skeleton, consistent with widespread sclerotic metastatic disease. In Aamirah Salmi patient of this age, prostate cancer would be the diagnosis of exclusion. 2. Marked enlargement of the prostate. Please correlate with  serum PSA for evidence of underlying prostate cancer. 3. Nonspecific right middle lobe pulmonary nodule with mean diameter of 8 mm. Primary malignancy or metastatic disease is suspected given associated bony findings. 4. Bilateral hydronephrosis, hydroureter, and distended urinary bladder with multiple trabeculations, consistent with chronic bladder outlet obstruction. Nonobstructing bladder calculi and nonspecific bladder mural calcifications as above. 5. Moderate bilateral pleural effusions and compressive lower lobe atelectasis. 6. Aortic Atherosclerosis (ICD10-I70.0) and Emphysema (ICD10-J43.9). Electronically Signed   By: Randa Ngo M.D.   On: 09/21/2021 19:15   ECHOCARDIOGRAM COMPLETE  Result Date: 09/21/2021    ECHOCARDIOGRAM REPORT   Patient Name:   RUDDY SWIRE Date of Exam: 09/21/2021 Medical Rec #:  [id number]       Height:       73.0 in Accession #:    [id number]      Weight:       135.0 lb Date of Birth:  08-15-1946        BSA:          1.821 m Patient Age:    1 years        BP:           155/53 mmHg Patient Gender: M               HR:           75 bpm. Exam Location:  Inpatient Procedure: 2D Echo, Cardiac Doppler and Color Doppler Indications:    Abnormal ECG R94.31  History:        Patient has no prior history of Echocardiogram examinations.                 Abnormal ECG.  Sonographer:    Merrie Roof RDCS Referring Phys: [id number] [place]  Sonographer Comments: No apical window. Too skinny. Won't or can't lay on side IMPRESSIONS  1. Left ventricular ejection fraction, by estimation, is 55 to 60%. The left ventricle has normal function. The left ventricle has no regional wall motion abnormalities. There is mild concentric left ventricular hypertrophy. Left  ventricular diastolic function could not be evaluated.  2. Right ventricular systolic function is normal. The right ventricular size is normal. There is mildly elevated pulmonary artery systolic pressure.  3. Left atrial size was mildly dilated.  4. The mitral valve is normal in structure. Trivial mitral valve regurgitation.  5. Tricuspid valve regurgitation is mild to moderate.  6. The aortic valve is tricuspid. There is mild calcification of the aortic valve. There is mild thickening of the aortic valve. Aortic valve regurgitation is mild to moderate. Aortic valve sclerosis is present, with no evidence of aortic valve stenosis. FINDINGS  Left Ventricle: Left ventricular ejection fraction, by estimation, is 55 to 60%. The left ventricle has normal function. The left ventricle has no regional wall motion abnormalities. The left ventricular internal cavity size was normal in size. There is  mild concentric left ventricular hypertrophy. Left ventricular diastolic function could not be evaluated. Right Ventricle: The right ventricular size is normal. No increase in right ventricular wall thickness. Right ventricular systolic function is normal. There is mildly elevated pulmonary artery systolic pressure. The tricuspid regurgitant velocity is 3.19  m/s, and with an assumed right atrial pressure of 3 mmHg, the estimated right ventricular systolic pressure is 29.9 mmHg. Left Atrium: Left atrial size was mildly dilated. Right Atrium: Right atrial size was normal in size. Prominent Chiari network. Pericardium: There is no evidence of pericardial effusion. Mitral Valve: The mitral  valve is normal in structure. Trivial mitral valve regurgitation. Tricuspid Valve: The tricuspid valve is normal in structure. Tricuspid valve regurgitation is mild to moderate. Aortic Valve: The aortic valve is tricuspid. There is mild calcification of the aortic valve. There is mild thickening of the aortic valve. Aortic valve regurgitation is  mild to moderate. Aortic valve sclerosis is present, with no evidence of aortic valve stenosis. Pulmonic Valve: The pulmonic valve was grossly normal. Pulmonic valve regurgitation is trivial. Aorta: The aortic root is normal in size and structure. IAS/Shunts: The interatrial septum was not well visualized.  LEFT VENTRICLE PLAX 2D LVIDd:         4.20 cm LVIDs:         3.00 cm LV PW:         1.20 cm LV IVS:        1.10 cm LVOT diam:     2.23 cm LVOT Area:     3.91 cm  LEFT ATRIUM         Index LA diam:    4.40 cm 2.42 cm/m   AORTA Ao Root diam: 3.20 cm TRICUSPID VALVE TR Peak grad:   40.7 mmHg TR Vmax:        319.00 cm/s  SHUNTS Systemic Diam: 2.23 cm Dani Gobble Croitoru MD Electronically signed by Sanda Klein MD Signature Date/Time: 09/21/2021/4:49:54 PM    Final    [place] ASCITES (ABDOMEN LIMITED)  Result Date: 09/22/2021 CLINICAL DATA:  Evaluate for ascites. EXAM: LIMITED ABDOMEN ULTRASOUND FOR ASCITES TECHNIQUE: Limited ultrasound survey for ascites was performed in all four abdominal quadrants. COMPARISON:  CT abdomen 09/21/2021 FINDINGS: No ascites identified in the 4 quadrants of the abdomen. Again noted is dilatation of the left renal collecting system and similar to the recent CT findings. IMPRESSION: 1. No ascites. 2. Partial visualization of the known left hydronephrosis. Electronically Signed   By: Markus Daft M.D.   On: 09/22/2021 13:00        Scheduled Meds:  sodium zirconium cyclosilicate  10 g Oral Once   Continuous Infusions:  sodium chloride 10 mL/hr at 09/21/21 1032     LOS: 0 days    Time spent: over 30 min    Fayrene Helper, MD [facility]   To contact the attending provider between 7A-7P or the covering provider during after hours 7P-7A, please log into the web site [email or web address] and access using universal [facility] password for that web site. If you do not have the password, please call the hospital operator.  09/22/2021, 7:57 PM

## 2021-09-22 NOTE — Assessment & Plan Note (Signed)
Creatinine ~1.4, mild hyperkalemia Unclear baseline, possibly chronic Vs acute related to [facility] Appreciate urology assistance Follow UA

## 2021-09-22 NOTE — Hospital Course (Signed)
75 yo with hx etoh abuse and tobacco abuse, but limited medical care, who presented to Urgent Care 1 week ago with SOB and was directed to the ED with an abnormal x ray.  He presented with progressive SOB.  Notes 40 lbs weight loss over past few months.  Generalized weakness. Imaging is concerning for metastatic prostate cancer.  Plan is for thoracentesis, urology and oncology c/s.  See below for additional recs

## 2021-09-22 NOTE — Assessment & Plan Note (Signed)
Pending Follow cytology and pleural fluid

## 2021-09-22 NOTE — Consult Note (Signed)
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 .  75 y.o. male with history of chronic tobacco use presenting with shortness of breath and imaging findings concerning for bilateral pleural effusions concerning for metastatic pleural effusions in the setting of pulmonary nodules, heterogenously enlarged prostate, distended bladder with bilateral hydroureteronephrosis down to the level of the bladder, PSA of over 900, positive fecal occult blood test which is all concerning for metastatic prostate cancer.  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  Patient states that he is most bothered by difficulty breathing.  He endorses that he has had lower urinary tract symptoms for quite some time and feels that he does not empty his bladder adequately.  He denies fevers or UTIs.  He states repeatedly that he is not interested in treatments and defers Foley catheter placement as well as a bedside digital rectal exam.   Past Medical History: History reviewed. No pertinent past medical history.  Past Surgical History:  History reviewed. No pertinent surgical history.  Medication: Current Facility-Administered Medications  Medication Dose Route Frequency Provider Last Rate Last Admin   0.9 %  sodium chloride infusion   Intravenous Continuous Reubin Milan, MD 10 mL/hr at 09/21/21 1032 New Bag at 09/21/21 1032   acetaminophen (TYLENOL) tablet 650 mg  650 mg Oral Q6H PRN Reubin Milan, MD       Or   acetaminophen (TYLENOL) suppository 650 mg  650 mg Rectal Q6H PRN  Reubin Milan, MD       albuterol (PROVENTIL) (2.5 MG/3ML) 0.083% nebulizer solution 2.5 mg  2.5 mg Nebulization Q4H PRN Reubin Milan, MD       nicotine (NICODERM CQ - dosed in mg/24 hours) patch 21 mg  21 mg Transdermal Daily PRN Reubin Milan, MD       ondansetron [facility]) tablet 4 mg  4 mg Oral Q6H PRN Reubin Milan, MD       Or   ondansetron [facility]) injection 4 mg  4 mg Intravenous Q6H PRN Reubin Milan, MD       sodium zirconium cyclosilicate (LOKELMA) packet 10 g  10 g Oral Once Elodia Florence., MD        Allergies: No Known Allergies  Social History: Social History   Tobacco Use   Smoking status: Heavy Smoker    Packs/day: 1.00    Types: Cigarettes   Smokeless tobacco: Never  Vaping Use   Vaping Use: Never used  Substance Use Topics   Alcohol use: Yes   Drug use: Never    Family History Family History  Problem Relation Age of Onset   Lung cancer Mother    Congestive Heart Failure Father     Review of Systems 10 systems were reviewed and are negative except as noted specifically in the HPI.  Objective   Vital signs in last 24 hours: BP (!) 136/57    Pulse 72    Temp 97.7 F (36.5 C) (Oral)    Resp (!) 23    Ht 6\' 1"  (1.854 m)    Wt 61.2 kg  SpO2 98%    BMI 17.81 kg/m   Physical Exam General: NAD, A&O, resting, appropriate cachectic appearing HEENT: [place]/AT, EOMI, MMM Pulmonary: Normal work of breathing Cardiovascular: HDS, adequate peripheral perfusion Abdomen: Soft, GU: Voiding spontaneously DRE: Deferred by patient Extremities: warm and well perfused Neuro: Appropriate, no focal neurological deficits  Most Recent Labs: Lab Results  Component Value Date   WBC 7.5 09/22/2021   HGB 7.0 (L) 09/22/2021   HCT 22.8 (L) 09/22/2021   PLT 50 (L) 09/22/2021    Lab Results  Component Value Date   NA 139 09/22/2021   K 5.4 (H) 09/22/2021   CL 113 (H) 09/22/2021   CO2 20 (L) 09/22/2021   BUN 33 (H) 09/22/2021    CREATININE 1.44 (H) 09/22/2021   CALCIUM 7.9 (L) 09/22/2021    No results found for: INR, APTT   Urine Culture: @[id number] (laburin,org,[id number])@   IMAGING: DG Chest 2 View  Result Date: 09/21/2021 CLINICAL DATA:  Shortness of breath, cough, weight loss EXAM: CHEST - 2 VIEW COMPARISON:  09/21/2021 FINDINGS: Normal heart size, mediastinal contours, and pulmonary vascularity. Atherosclerotic calcification aorta. Bibasilar effusions and atelectasis. Remaining lungs clear. No pneumothorax. Diffuse osteosclerosis, patchy, highly suspicious for osseous metastatic disease. Biconvex thoracic scoliosis. IMPRESSION: Bibasilar pleural effusions and atelectasis. Diffuse patchy osteosclerosis, highly suspicious for osseous metastatic disease. Electronically Signed   By: Lavonia Dana M.D.   On: 09/21/2021 10:52   CT CHEST W CONTRAST  Result Date: 09/21/2021 CLINICAL DATA:  Short of breath, cough, weight loss, suspected bony metastases on chest x-ray EXAM: CT CHEST, ABDOMEN, AND PELVIS WITH CONTRAST TECHNIQUE: Multidetector CT imaging of the chest, abdomen and pelvis was performed following the standard protocol during bolus administration of intravenous contrast. CONTRAST:  87mL OMNIPAQUE IOHEXOL 350 MG/ML SOLN COMPARISON:  09/21/2021 FINDINGS: CT CHEST FINDINGS Cardiovascular: The heart and great vessels are unremarkable without pericardial effusion. No evidence of thoracic aortic aneurysm or dissection. Atherosclerosis of the aorta and coronary vasculature. Mediastinum/Nodes: No enlarged mediastinal, hilar, or axillary lymph nodes. Thyroid gland, trachea, and esophagus demonstrate no significant findings. Lungs/Pleura: Moderate bilateral pleural effusions estimated volume in excess of 1 L each. There is dependent consolidation within the lower lobes compatible with compressive atelectasis. No acute airspace disease. No pneumothorax. Mild upper lobe predominant emphysema. Central airways are patent.  Indeterminate 9 x 7 x 8 mm right middle lobe pulmonary nodule, reference image 148/5. Musculoskeletal: The bones are diffusely sclerotic, consistent with diffuse bony metastases. Chronic appearing right lateral eleventh rib fracture. No other displaced fractures. CT ABDOMEN PELVIS FINDINGS Hepatobiliary: No focal liver abnormality is seen. No gallstones, gallbladder wall thickening, or biliary dilatation. Pancreas: Unremarkable. No pancreatic ductal dilatation or surrounding inflammatory changes. Spleen: Normal in size without focal abnormality. Adrenals/Urinary Tract: Mild bilateral renal cortical atrophy, right greater than left. There is bilateral obstructive uropathy, as well as marked distension of the urinary bladder with multiple trabeculations. Findings are most consistent with chronic bladder outlet obstruction. Dependent calcifications within the bladder consistent with small cysts to let us. Largest measures 4 mm. Nonspecific mural calcification along and anterior trabeculation, measuring 7 mm reference image 120/3. Adrenals appear unremarkable. Stomach/Bowel: No bowel obstruction or ileus. No bowel wall thickening or inflammatory changes. Vascular/Lymphatic: Extensive atherosclerosis throughout the aorta and its branches. No pathologic adenopathy within the abdomen or pelvis. Reproductive: The prostate is enlarged and somewhat heterogeneous, measuring approximately 5.4 x 5.1 by 5.1 cm. Other: No free fluid or free intraperitoneal gas. No abdominal wall hernia. Musculoskeletal: Visualized bony structures  are diffusely sclerotic, consistent with diffuse metastatic disease. There are no pathologic fractures. Bilateral hip osteoarthritis, right greater than left. Reconstructed images demonstrate no additional findings. IMPRESSION: 1. Diffuse bony sclerosis throughout the axial and visualized appendicular skeleton, consistent with widespread sclerotic metastatic disease. In a patient of this age, prostate  cancer would be the diagnosis of exclusion. 2. Marked enlargement of the prostate. Please correlate with serum PSA for evidence of underlying prostate cancer. 3. Nonspecific right middle lobe pulmonary nodule with mean diameter of 8 mm. Primary malignancy or metastatic disease is suspected given associated bony findings. 4. Bilateral hydronephrosis, hydroureter, and distended urinary bladder with multiple trabeculations, consistent with chronic bladder outlet obstruction. Nonobstructing bladder calculi and nonspecific bladder mural calcifications as above. 5. Moderate bilateral pleural effusions and compressive lower lobe atelectasis. 6. Aortic Atherosclerosis (ICD10-I70.0) and Emphysema (ICD10-J43.9). Electronically Signed   By: Randa Ngo M.D.   On: 09/21/2021 19:15   CT ABDOMEN PELVIS W CONTRAST  Result Date: 09/21/2021 CLINICAL DATA:  Short of breath, cough, weight loss, suspected bony metastases on chest x-ray EXAM: CT CHEST, ABDOMEN, AND PELVIS WITH CONTRAST TECHNIQUE: Multidetector CT imaging of the chest, abdomen and pelvis was performed following the standard protocol during bolus administration of intravenous contrast. CONTRAST:  10mL OMNIPAQUE IOHEXOL 350 MG/ML SOLN COMPARISON:  09/21/2021 FINDINGS: CT CHEST FINDINGS Cardiovascular: The heart and great vessels are unremarkable without pericardial effusion. No evidence of thoracic aortic aneurysm or dissection. Atherosclerosis of the aorta and coronary vasculature. Mediastinum/Nodes: No enlarged mediastinal, hilar, or axillary lymph nodes. Thyroid gland, trachea, and esophagus demonstrate no significant findings. Lungs/Pleura: Moderate bilateral pleural effusions estimated volume in excess of 1 L each. There is dependent consolidation within the lower lobes compatible with compressive atelectasis. No acute airspace disease. No pneumothorax. Mild upper lobe predominant emphysema. Central airways are patent. Indeterminate 9 x 7 x 8 mm right middle  lobe pulmonary nodule, reference image 148/5. Musculoskeletal: The bones are diffusely sclerotic, consistent with diffuse bony metastases. Chronic appearing right lateral eleventh rib fracture. No other displaced fractures. CT ABDOMEN PELVIS FINDINGS Hepatobiliary: No focal liver abnormality is seen. No gallstones, gallbladder wall thickening, or biliary dilatation. Pancreas: Unremarkable. No pancreatic ductal dilatation or surrounding inflammatory changes. Spleen: Normal in size without focal abnormality. Adrenals/Urinary Tract: Mild bilateral renal cortical atrophy, right greater than left. There is bilateral obstructive uropathy, as well as marked distension of the urinary bladder with multiple trabeculations. Findings are most consistent with chronic bladder outlet obstruction. Dependent calcifications within the bladder consistent with small cysts to let us. Largest measures 4 mm. Nonspecific mural calcification along and anterior trabeculation, measuring 7 mm reference image 120/3. Adrenals appear unremarkable. Stomach/Bowel: No bowel obstruction or ileus. No bowel wall thickening or inflammatory changes. Vascular/Lymphatic: Extensive atherosclerosis throughout the aorta and its branches. No pathologic adenopathy within the abdomen or pelvis. Reproductive: The prostate is enlarged and somewhat heterogeneous, measuring approximately 5.4 x 5.1 by 5.1 cm. Other: No free fluid or free intraperitoneal gas. No abdominal wall hernia. Musculoskeletal: Visualized bony structures are diffusely sclerotic, consistent with diffuse metastatic disease. There are no pathologic fractures. Bilateral hip osteoarthritis, right greater than left. Reconstructed images demonstrate no additional findings. IMPRESSION: 1. Diffuse bony sclerosis throughout the axial and visualized appendicular skeleton, consistent with widespread sclerotic metastatic disease. In a patient of this age, prostate cancer would be the diagnosis of exclusion.  2. Marked enlargement of the prostate. Please correlate with serum PSA for evidence of underlying prostate cancer. 3. Nonspecific right middle lobe pulmonary  nodule with mean diameter of 8 mm. Primary malignancy or metastatic disease is suspected given associated bony findings. 4. Bilateral hydronephrosis, hydroureter, and distended urinary bladder with multiple trabeculations, consistent with chronic bladder outlet obstruction. Nonobstructing bladder calculi and nonspecific bladder mural calcifications as above. 5. Moderate bilateral pleural effusions and compressive lower lobe atelectasis. 6. Aortic Atherosclerosis (ICD10-I70.0) and Emphysema (ICD10-J43.9). Electronically Signed   By: Randa Ngo M.D.   On: 09/21/2021 19:15   ECHOCARDIOGRAM COMPLETE  Result Date: 09/21/2021    ECHOCARDIOGRAM REPORT   Patient Name:   SMILEY BIRR Date of Exam: 09/21/2021 Medical Rec #:  [id number]       Height:       73.0 in Accession #:    [id number]      Weight:       135.0 lb Date of Birth:  [birth year]        BSA:          1.821 m Patient Age:    8 years        BP:           155/53 mmHg Patient Gender: M               HR:           75 bpm. Exam Location:  Inpatient Procedure: 2D Echo, Cardiac Doppler and Color Doppler Indications:    Abnormal ECG R94.31  History:        Patient has no prior history of Echocardiogram examinations.                 Abnormal ECG.  Sonographer:    Merrie Roof RDCS Referring Phys: [id number] [place]  Sonographer Comments: No apical window. Too skinny. Won't or can't lay on side IMPRESSIONS  1. Left ventricular ejection fraction, by estimation, is 55 to 60%. The left ventricle has normal function. The left ventricle has no regional wall motion abnormalities. There is mild concentric left ventricular hypertrophy. Left ventricular diastolic function could not be evaluated.  2. Right ventricular systolic function is normal. The right ventricular size is normal. There is mildly elevated  pulmonary artery systolic pressure.  3. Left atrial size was mildly dilated.  4. The mitral valve is normal in structure. Trivial mitral valve regurgitation.  5. Tricuspid valve regurgitation is mild to moderate.  6. The aortic valve is tricuspid. There is mild calcification of the aortic valve. There is mild thickening of the aortic valve. Aortic valve regurgitation is mild to moderate. Aortic valve sclerosis is present, with no evidence of aortic valve stenosis. FINDINGS  Left Ventricle: Left ventricular ejection fraction, by estimation, is 55 to 60%. The left ventricle has normal function. The left ventricle has no regional wall motion abnormalities. The left ventricular internal cavity size was normal in size. There is  mild concentric left ventricular hypertrophy. Left ventricular diastolic function could not be evaluated. Right Ventricle: The right ventricular size is normal. No increase in right ventricular wall thickness. Right ventricular systolic function is normal. There is mildly elevated pulmonary artery systolic pressure. The tricuspid regurgitant velocity is 3.19  m/s, and with an assumed right atrial pressure of 3 mmHg, the estimated right ventricular systolic pressure is 90.2 mmHg. Left Atrium: Left atrial size was mildly dilated. Right Atrium: Right atrial size was normal in size. Prominent Chiari network. Pericardium: There is no evidence of pericardial effusion. Mitral Valve: The mitral valve is normal in structure. Trivial mitral valve regurgitation. Tricuspid Valve: The tricuspid valve  is normal in structure. Tricuspid valve regurgitation is mild to moderate. Aortic Valve: The aortic valve is tricuspid. There is mild calcification of the aortic valve. There is mild thickening of the aortic valve. Aortic valve regurgitation is mild to moderate. Aortic valve sclerosis is present, with no evidence of aortic valve stenosis. Pulmonic Valve: The pulmonic valve was grossly normal. Pulmonic valve  regurgitation is trivial. Aorta: The aortic root is normal in size and structure. IAS/Shunts: The interatrial septum was not well visualized.  LEFT VENTRICLE PLAX 2D LVIDd:         4.20 cm LVIDs:         3.00 cm LV PW:         1.20 cm LV IVS:        1.10 cm LVOT diam:     2.23 cm LVOT Area:     3.91 cm  LEFT ATRIUM         Index LA diam:    4.40 cm 2.42 cm/m   AORTA Ao Root diam: 3.20 cm TRICUSPID VALVE TR Peak grad:   40.7 mmHg TR Vmax:        319.00 cm/s  SHUNTS Systemic Diam: 2.23 cm Dani Gobble Croitoru MD Electronically signed by Sanda Klein MD Signature Date/Time: 09/21/2021/4:49:54 PM    Final    [place] ASCITES (ABDOMEN LIMITED)  Result Date: 09/22/2021 CLINICAL DATA:  Evaluate for ascites. EXAM: LIMITED ABDOMEN ULTRASOUND FOR ASCITES TECHNIQUE: Limited ultrasound survey for ascites was performed in all four abdominal quadrants. COMPARISON:  CT abdomen 09/21/2021 FINDINGS: No ascites identified in the 4 quadrants of the abdomen. Again noted is dilatation of the left renal collecting system and similar to the recent CT findings. IMPRESSION: 1. No ascites. 2. Partial visualization of the known left hydronephrosis. Electronically Signed   By: Markus Daft M.D.   On: 09/22/2021 13:00    ------  Assessment:  75 y.o. male with history of chronic tobacco use presenting with shortness of breath and imaging findings concerning for bilateral pleural effusions concerning for metastatic pleural effusions in the setting of pulmonary nodules, heterogenously enlarged prostate, distended bladder with bilateral hydroureteronephrosis down to the level of the bladder, PSA of over 900, positive fecal occult blood test which is all concerning for metastatic prostate cancer.  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  With regards to his hydronephrosis, his current creatinine is 1.4 from unknown baseline.  The hydronephrosis is down to the level of the bladder which suggests  bladder outlet obstruction likely from his enlarged prostate/likely prostate cancer.  Urinalysis has not yet been collected.  Would recommend obtaining a postvoid residual bladder scan.  If patient continues to retain, or if urinalysis concerning for infection would recommend Foley catheter placement for decompression of his urinary tract.  With regards to a possible prostate cancer diagnosis, there is significant suspicion given the aforementioned findings.  He is to undergo a thoracentesis with cytology tomorrow per the primary team.  This should shed some light on the etiology which could be a malignant pleural effusion.  A goals of care discussion should be had with the patient.  If he chooses treatment or even a palliative treatment course for likely prostate cancer, a tissue diagnosis will likely be necessary for medical oncology purposes prior to proceeding with androgen deprivation or androgen blockade therapy.  In this case we can discuss with the patient whether he would prefer an outpatient prostate biopsy versus a biopsy with interventional radiology  of either of his lesions suspicious for metastasis.  If patient were to become interested in treatment, the patient could also benefit from digarelix/androgen deprivation therapy as mentioned above to lower testosterone levels and PSA as a palliative measure for his metastatic prostate cancer.  Based on my bedside conversation with the patient he is not currently interested.  Recommendations: -Patient defers Foley catheter for bladder decompression, which would likely also improve his bilateral hydroureteronephrosis -Patient defers digital rectal examination as well as prostate biopsy at this time.  Patient would become interested in treatment as mentioned above we can discuss androgen deprivation therapy with him.  We are happy to be involved in this patient's care. -Patient continues to defer care given his cachectic state and advanced disease  burden, he may be a candidate for hospice   Thank you for this consult. Please contact the urology consult pager with any further questions/concerns.

## 2021-09-22 NOTE — Assessment & Plan Note (Signed)
Likely related to malignancy

## 2021-09-22 NOTE — Progress Notes (Signed)
Patient presented to radiology for paracentesis today.   Abdomen visualized with ultrasound.  No fluid collection for safe approach found.  Ultrasound images saved for documentation purpose.   Paracentesis was NOT performed.   Armando Gang Puneet Selden PA-C 09/22/2021 12:48 PM

## 2021-09-22 NOTE — Assessment & Plan Note (Signed)
B12, folate, iron, ferritin Type and screen Transfuse for <7

## 2021-09-22 NOTE — Assessment & Plan Note (Signed)
Mild, lokelma x1, follow

## 2021-09-22 NOTE — Assessment & Plan Note (Signed)
No gross bloody stools Suspect related to severe thrombocytopenia Consider GI c/s if evidence of blood in stool

## 2021-09-23 ENCOUNTER — Inpatient Hospital Stay (HOSPITAL_COMMUNITY): Payer: Medicare Other

## 2021-09-23 DIAGNOSIS — N179 Acute kidney failure, unspecified: Secondary | ICD-10-CM

## 2021-09-23 DIAGNOSIS — J90 Pleural effusion, not elsewhere classified: Secondary | ICD-10-CM | POA: Diagnosis not present

## 2021-09-23 DIAGNOSIS — N1330 Unspecified hydronephrosis: Secondary | ICD-10-CM

## 2021-09-23 LAB — BODY FLUID CELL COUNT WITH DIFFERENTIAL
Eos, Fluid: 0 %
Lymphs, Fluid: 14 %
Monocyte-Macrophage-Serous Fluid: 61 % (ref 50–90)
Neutrophil Count, Fluid: 25 % (ref 0–25)
Total Nucleated Cell Count, Fluid: 272 cu mm (ref 0–1000)

## 2021-09-23 LAB — PROTEIN, PLEURAL OR PERITONEAL FLUID: Total protein, fluid: <3 g/dL

## 2021-09-23 LAB — GLUCOSE, PLEURAL OR PERITONEAL FLUID: Glucose, Fluid: 93 mg/dL

## 2021-09-23 LAB — LACTATE DEHYDROGENASE, PLEURAL OR PERITONEAL FLUID: LD, Fluid: 145 U/L — ABNORMAL HIGH (ref 3–23)

## 2021-09-23 MED ORDER — LIDOCAINE HCL 1 % IJ SOLN
INTRAMUSCULAR | Status: AC
  Administered 2021-09-23: 12:00:00 10 mL
  Filled 2021-09-23: qty 20

## 2021-09-23 MED ORDER — SODIUM ZIRCONIUM CYCLOSILICATE 10 G PO PACK
10.0000 g | PACK | Freq: Two times a day (BID) | ORAL | Status: DC
Start: 2021-09-23 — End: 2021-09-24
  Administered 2021-09-23: 22:00:00 10 g via ORAL
  Filled 2021-09-23 (×3): qty 1

## 2021-09-23 NOTE — Progress Notes (Addendum)
PROGRESS NOTE    Brian Cameron   CWC:[id number]  DOB: 1945/12/01  DOA: 09/21/2021 PCP: Merryl Hacker, No   Brief Narrative:  Brian Cameron has a h/o ETOH and tobacco abuse who presented to Urgent Care 1 week ago with SOB and was directed to the ED with an abnormal x ray.  He presented with progressive SOB.  Notes 40 lbs weight loss over past few months and has generalized weakness.   CXR > Bibasilar pleural effusions and atelectasis. Diffuse patchy osteosclerosis, highly suspicious for osseous metastatic disease.   Subjective: States he is still short of breath when he gets up and moves around.  He has a mild cough.  He has no other complaints.    Assessment & Plan:   Principal Problem: Shortness of breath on exertion - This is main complaint-at this time we have told him that he will undergo thoracentesis and we will see if this helps his dyspnea to improve -IR has removed 825 cc of yellow pleural fluid from the left pleural space - Repeat chest x-ray shows decreased left-sided effusion and persistent medium right sided effusion --follow-up cytology -He is not hypoxic - 2 D ECHO > normal EF - mild LVH- could not determine diastolic function- mild-mod TR - He is a lifelong smoker and has never had a work-up for emphysema and I have explained to him that his dyspnea may also be related to emphysema  Active Problems: Bilateral pleural effusions, pulmonary nodules, 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 skeleton, enlarged prostate with distended bladder and bilateral hydroureteronephrosis to the level of the bladder, PSA 975.26 -Urology has evaluated the patient and feels that the hydronephrosis is likely secondary to the enlarged prostate and recommends a Foley catheter-the patient has declined this - The patient deferred a digital rectal aid exam and a prostate biopsy as well - Based on the urology note, given his cachectic state and advanced disease burden, he may  be a candidate for hospice --As of today he was not willing to talk about anything until his shortness of breath was addressed-we will discuss these findings further tomorrowfor now I will go ahead and consult palliative care  Cachexia    Hypoalbuminemia Severe protein calorie malnutrition - The patient is cachectic on exam with significant amount of muscle wasting Body mass index is 17.81 kg/m. -Likely related to underlying cancer    Hyperkalemia -We will start Lokelma as potassium remains 5.4 -Likely related to elevated creatinine & poor renal clearance  Elevated creatinine with metabolic acidosis - Creatinine was 1.37 and has risen to 1.44 - Suspect this is related to enlarged prostate causing urinary obstruction-again, he refused a Foley catheter  Macrocytic anemia, thrombocytopenia ([place]) - ?  If secondary to chronic alcohol abuse addition to extensive bony mets - Hemoglobin is around 7 and platelets in the 50s Vitamin B12 and iron panel consistent with anemia of chronic disease -Retake count is poor at 2.25    Positive fecal occult blood test -Follow for now  Nicotine abuse - Smokes cigarettes-started when he was in his 24s has quit for short periods of time but recently has been smoking again - Continue NicoDerm patch  Alcohol abuse - No signs of alcohol withdrawal as of yet   Disposition.  Patient is intermittently refusing lab work and has declined many treatments that have been offered to him so far. He does not have a PCP either and will likely be lost to follow-up..   Time spent in  minutes: 35 DVT prophylaxis: SCDs Start: 09/21/21 1220 Code Status: Full code Family Communication:  Level of Care: Level of care: Telemetry Disposition Plan:  Status is: Inpatient  Remains inpatient appropriate because: Shortness of breath      Consultants:  Urology Procedures:  Left thoracentesis Antimicrobials:  Anti-infectives (From admission, onward)    None         Objective: Vitals:   09/23/21 0457 09/23/21 1100 09/23/21 1154 09/23/21 1720  BP: 131/61 133/64 131/60 (!) 122/59  Pulse: 70   69  Resp: 16   16  Temp: 97.8 F (36.6 C)   (!) 97.5 F (36.4 C)  TempSrc: Oral   Oral  SpO2: 97%   96%  Weight:      Height:        Intake/Output Summary (Last 24 hours) at 09/23/2021 1833 Last data filed at 09/23/2021 5885 Gross per 24 hour  Intake 240 ml  Output --  Net 240 ml   Filed Weights   09/21/21 0947  Weight: 61.2 kg    Examination: General exam: Appears comfortable-cachectic elderly man HEENT: PERRLA, oral mucosa moist, no sclera icterus or thrush Respiratory system: Crackles at bases Cardiovascular system: S1 & S2 heard, RRR.   Gastrointestinal system: Abdomen soft, non-tender, nondistended. Normal bowel sounds. Central nervous system: Alert and oriented. No focal neurological deficits. Extremities: No cyanosis, clubbing or edema Skin: No rashes or ulcers Psychiatry:  Mood & affect appropriate.     Data Reviewed: I have personally reviewed following labs and imaging studies  CBC: Recent Labs  Lab 09/21/21 1030 09/21/21 1826 09/22/21 0523  WBC 8.1  --  7.5  NEUTROABS 2.5  --   --   HGB 7.7* 7.0* 7.0*  HCT 26.2* 23.7* 22.8*  MCV 108.7*  --  106.0*  PLT 52*  --  50*   Basic Metabolic Panel: Recent Labs  Lab 09/21/21 1030 09/21/21 1826 09/22/21 0523  NA 140 140 139  K 5.4* 5.4* 5.4*  CL 117* 113* 113*  CO2 17* 20* 20*  GLUCOSE 97 98 96  BUN 29* 35* 33*  CREATININE 1.37* 1.43* 1.44*  CALCIUM 7.9* 7.9* 7.9*   GFR: Estimated Creatinine Clearance: 37.2 mL/min (A) (by C-G formula based on SCr of 1.44 mg/dL (H)). Liver Function Tests: Recent Labs  Lab 09/21/21 1030 09/22/21 0523  AST 40 37  ALT 21 19  ALKPHOS 683* 693*  BILITOT 0.3 0.5  PROT 5.9* 5.9*  ALBUMIN 3.0* 2.9*   No results for input(s): LIPASE, AMYLASE in the last 168 hours. No results for input(s): AMMONIA in the last 168  hours. Coagulation Profile: No results for input(s): INR, PROTIME in the last 168 hours. Cardiac Enzymes: No results for input(s): CKTOTAL, CKMB, CKMBINDEX, TROPONINI in the last 168 hours. BNP (last 3 results) No results for input(s): PROBNP in the last 8760 hours. HbA1C: No results for input(s): HGBA1C in the last 72 hours. CBG: No results for input(s): GLUCAP in the last 168 hours. Lipid Profile: No results for input(s): CHOL, HDL, LDLCALC, TRIG, CHOLHDL, LDLDIRECT in the last 72 hours. Thyroid Function Tests: No results for input(s): TSH, T4TOTAL, FREET4, T3FREE, THYROIDAB in the last 72 hours. Anemia Panel: Recent Labs    09/21/21 1030 09/21/21 1300  VITAMINB12 418  --   FOLATE 6.7  --   FERRITIN 89  --   TIBC 372  --   IRON 115  --   RETICCTPCT  --  2.9   Urine analysis: No results found  for: COLORURINE, APPEARANCEUR, LABSPEC, PHURINE, GLUCOSEU, HGBUR, BILIRUBINUR, KETONESUR, PROTEINUR, UROBILINOGEN, NITRITE, LEUKOCYTESUR Sepsis Labs: @LABRCNTIP (procalcitonin:4,lacticidven:4) ) Recent Results (from the past 240 hour(s))  Resp Panel by RT-PCR (Flu A&B, Covid) Nasopharyngeal Swab     Status: None   Collection Time: 09/21/21 11:18 AM   Specimen: Nasopharyngeal Swab; Nasopharyngeal(NP) swabs in vial transport medium  Result Value Ref Range Status   SARS Coronavirus 2 by RT PCR NEGATIVE NEGATIVE Final    Comment: (NOTE) SARS-CoV-2 target nucleic acids are NOT DETECTED.  The SARS-CoV-2 RNA is generally detectable in upper respiratory specimens during the acute phase of infection. The lowest concentration of SARS-CoV-2 viral copies this assay can detect is 138 copies/mL. A negative result does not preclude SARS-Cov-2 infection and should not be used as the sole basis for treatment or other patient management decisions. A negative result may occur with  improper specimen collection/handling, submission of specimen other than nasopharyngeal swab, presence of viral  mutation(s) within the areas targeted by this assay, and inadequate number of viral copies(<138 copies/mL). A negative result must be combined with clinical observations, patient history, and epidemiological information. The expected result is Negative.  Fact Sheet for Patients:  EntrepreneurPulse.com.au  Fact Sheet for Healthcare Providers:  IncredibleEmployment.be  This test is no t yet approved or cleared by the [place] FDA and  has been authorized for detection and/or diagnosis of SARS-CoV-2 by FDA under an Emergency Use Authorization (EUA). This EUA will remain  in effect (meaning this test can be used) for the duration of the COVID-19 declaration under Section 564(b)(1) of the Act, 21 U.S.C.section 360bbb-3(b)(1), unless the authorization is terminated  or revoked sooner.       Influenza A by PCR NEGATIVE NEGATIVE Final   Influenza B by PCR NEGATIVE NEGATIVE Final    Comment: (NOTE) The Xpert Xpress SARS-CoV-2/FLU/RSV plus assay is intended as an aid in the diagnosis of influenza from Nasopharyngeal swab specimens and should not be used as a sole basis for treatment. Nasal washings and aspirates are unacceptable for Xpert Xpress SARS-CoV-2/FLU/RSV testing.  Fact Sheet for Patients: EntrepreneurPulse.com.au  Fact Sheet for Healthcare Providers: IncredibleEmployment.be  This test is not yet approved or cleared by the [place] FDA and has been authorized for detection and/or diagnosis of SARS-CoV-2 by FDA under an Emergency Use Authorization (EUA). This EUA will remain in effect (meaning this test can be used) for the duration of the COVID-19 declaration under Section 564(b)(1) of the Act, 21 U.S.C. section 360bbb-3(b)(1), unless the authorization is terminated or revoked.  Performed at [facility], [place] [address], [place], [place] [address]   Body fluid culture w  Gram Stain     Status: None (Preliminary result)   Collection Time: 09/23/21 11:36 AM   Specimen: PATH Cytology Pleural fluid  Result Value Ref Range Status   Specimen Description   Final    PLEURAL LT Performed at [place] [address] [place], [place] [address]    Special Requests   Final    NONE Performed at [facility], [place] [address] [place], [place] [address]    Gram Stain   Final    WBC PRESENT,BOTH PMN AND MONONUCLEAR NO ORGANISMS SEEN CYTOSPIN SMEAR Performed at [facility], [place] [address], [place], [place] [address]    Culture PENDING  Incomplete   Report Status PENDING  Incomplete         Radiology Studies: CT CHEST W CONTRAST  Result Date:  09/21/2021 CLINICAL DATA:  Short of breath, cough, weight loss, suspected bony metastases on chest x-ray EXAM: CT CHEST, ABDOMEN, AND PELVIS WITH CONTRAST TECHNIQUE: Multidetector CT imaging of the chest, abdomen and pelvis was performed following the standard protocol during bolus administration of intravenous contrast. CONTRAST:  76mL OMNIPAQUE IOHEXOL 350 MG/ML SOLN COMPARISON:  09/21/2021 FINDINGS: CT CHEST FINDINGS Cardiovascular: The heart and great vessels are unremarkable without pericardial effusion. No evidence of thoracic aortic aneurysm or dissection. Atherosclerosis of the aorta and coronary vasculature. Mediastinum/Nodes: No enlarged mediastinal, hilar, or axillary lymph nodes. Thyroid gland, trachea, and esophagus demonstrate no significant findings. Lungs/Pleura: Moderate bilateral pleural effusions estimated volume in excess of 1 L each. There is dependent consolidation within the lower lobes compatible with compressive atelectasis. No acute airspace disease. No pneumothorax. Mild upper lobe predominant emphysema. Central airways are patent. Indeterminate 9 x 7 x 8 mm right middle lobe pulmonary nodule, reference image 148/5. Musculoskeletal: The bones are diffusely  sclerotic, consistent with diffuse bony metastases. Chronic appearing right lateral eleventh rib fracture. No other displaced fractures. CT ABDOMEN PELVIS FINDINGS Hepatobiliary: No focal liver abnormality is seen. No gallstones, gallbladder wall thickening, or biliary dilatation. Pancreas: Unremarkable. No pancreatic ductal dilatation or surrounding inflammatory changes. Spleen: Normal in size without focal abnormality. Adrenals/Urinary Tract: Mild bilateral renal cortical atrophy, right greater than left. There is bilateral obstructive uropathy, as well as marked distension of the urinary bladder with multiple trabeculations. Findings are most consistent with chronic bladder outlet obstruction. Dependent calcifications within the bladder consistent with small cysts to let us. Largest measures 4 mm. Nonspecific mural calcification along and anterior trabeculation, measuring 7 mm reference image 120/3. Adrenals appear unremarkable. Stomach/Bowel: No bowel obstruction or ileus. No bowel wall thickening or inflammatory changes. Vascular/Lymphatic: Extensive atherosclerosis throughout the aorta and its branches. No pathologic adenopathy within the abdomen or pelvis. Reproductive: The prostate is enlarged and somewhat heterogeneous, measuring approximately 5.4 x 5.1 by 5.1 cm. Other: No free fluid or free intraperitoneal gas. No abdominal wall hernia. Musculoskeletal: Visualized bony structures are diffusely sclerotic, consistent with diffuse metastatic disease. There are no pathologic fractures. Bilateral hip osteoarthritis, right greater than left. Reconstructed images demonstrate no additional findings. IMPRESSION: 1. Diffuse bony sclerosis throughout the axial and visualized appendicular skeleton, consistent with widespread sclerotic metastatic disease. In a patient of this age, prostate cancer would be the diagnosis of exclusion. 2. Marked enlargement of the prostate. Please correlate with serum PSA for evidence of  underlying prostate cancer. 3. Nonspecific right middle lobe pulmonary nodule with mean diameter of 8 mm. Primary malignancy or metastatic disease is suspected given associated bony findings. 4. Bilateral hydronephrosis, hydroureter, and distended urinary bladder with multiple trabeculations, consistent with chronic bladder outlet obstruction. Nonobstructing bladder calculi and nonspecific bladder mural calcifications as above. 5. Moderate bilateral pleural effusions and compressive lower lobe atelectasis. 6. Aortic Atherosclerosis (ICD10-I70.0) and Emphysema (ICD10-J43.9). Electronically Signed   By: Randa Ngo M.D.   On: 09/21/2021 19:15   CT ABDOMEN PELVIS W CONTRAST  Result Date: 09/21/2021 CLINICAL DATA:  Short of breath, cough, weight loss, suspected bony metastases on chest x-ray EXAM: CT CHEST, ABDOMEN, AND PELVIS WITH CONTRAST TECHNIQUE: Multidetector CT imaging of the chest, abdomen and pelvis was performed following the standard protocol during bolus administration of intravenous contrast. CONTRAST:  14mL OMNIPAQUE IOHEXOL 350 MG/ML SOLN COMPARISON:  09/21/2021 FINDINGS: CT CHEST FINDINGS Cardiovascular: The heart and great vessels are unremarkable without pericardial effusion. No evidence of thoracic aortic aneurysm or dissection. Atherosclerosis of  the aorta and coronary vasculature. Mediastinum/Nodes: No enlarged mediastinal, hilar, or axillary lymph nodes. Thyroid gland, trachea, and esophagus demonstrate no significant findings. Lungs/Pleura: Moderate bilateral pleural effusions estimated volume in excess of 1 L each. There is dependent consolidation within the lower lobes compatible with compressive atelectasis. No acute airspace disease. No pneumothorax. Mild upper lobe predominant emphysema. Central airways are patent. Indeterminate 9 x 7 x 8 mm right middle lobe pulmonary nodule, reference image 148/5. Musculoskeletal: The bones are diffusely sclerotic, consistent with diffuse bony  metastases. Chronic appearing right lateral eleventh rib fracture. No other displaced fractures. CT ABDOMEN PELVIS FINDINGS Hepatobiliary: No focal liver abnormality is seen. No gallstones, gallbladder wall thickening, or biliary dilatation. Pancreas: Unremarkable. No pancreatic ductal dilatation or surrounding inflammatory changes. Spleen: Normal in size without focal abnormality. Adrenals/Urinary Tract: Mild bilateral renal cortical atrophy, right greater than left. There is bilateral obstructive uropathy, as well as marked distension of the urinary bladder with multiple trabeculations. Findings are most consistent with chronic bladder outlet obstruction. Dependent calcifications within the bladder consistent with small cysts to let us. Largest measures 4 mm. Nonspecific mural calcification along and anterior trabeculation, measuring 7 mm reference image 120/3. Adrenals appear unremarkable. Stomach/Bowel: No bowel obstruction or ileus. No bowel wall thickening or inflammatory changes. Vascular/Lymphatic: Extensive atherosclerosis throughout the aorta and its branches. No pathologic adenopathy within the abdomen or pelvis. Reproductive: The prostate is enlarged and somewhat heterogeneous, measuring approximately 5.4 x 5.1 by 5.1 cm. Other: No free fluid or free intraperitoneal gas. No abdominal wall hernia. Musculoskeletal: Visualized bony structures are diffusely sclerotic, consistent with diffuse metastatic disease. There are no pathologic fractures. Bilateral hip osteoarthritis, right greater than left. Reconstructed images demonstrate no additional findings. IMPRESSION: 1. Diffuse bony sclerosis throughout the axial and visualized appendicular skeleton, consistent with widespread sclerotic metastatic disease. In a patient of this age, prostate cancer would be the diagnosis of exclusion. 2. Marked enlargement of the prostate. Please correlate with serum PSA for evidence of underlying prostate cancer. 3.  Nonspecific right middle lobe pulmonary nodule with mean diameter of 8 mm. Primary malignancy or metastatic disease is suspected given associated bony findings. 4. Bilateral hydronephrosis, hydroureter, and distended urinary bladder with multiple trabeculations, consistent with chronic bladder outlet obstruction. Nonobstructing bladder calculi and nonspecific bladder mural calcifications as above. 5. Moderate bilateral pleural effusions and compressive lower lobe atelectasis. 6. Aortic Atherosclerosis (ICD10-I70.0) and Emphysema (ICD10-J43.9). Electronically Signed   By: Randa Ngo M.D.   On: 09/21/2021 19:15   DG CHEST PORT 1 VIEW  Result Date: 09/23/2021 CLINICAL DATA:  Status post thoracentesis EXAM: PORTABLE CHEST 1 VIEW COMPARISON:  Chest radiograph and chest CT 09/21/2021 FINDINGS: The cardiomediastinal silhouette is stable. The left pleural effusion has decreased in size following thoracentesis. Aeration of the left lower lobe has improved. There is no pneumothorax. The small to medium size right pleural effusion is not significantly changed. Aeration of the right lung is not significantly changed. There is no right pneumothorax. Diffuse osseous sclerosis is unchanged. IMPRESSION: 1. Decreased size of the left pleural effusion following thoracentesis. No pneumothorax. 2. Unchanged right pleural effusion. Electronically Signed   By: Valetta Mole M.D.   On: 09/23/2021 12:22   [place] ASCITES (ABDOMEN LIMITED)  Result Date: 09/22/2021 CLINICAL DATA:  Evaluate for ascites. EXAM: LIMITED ABDOMEN ULTRASOUND FOR ASCITES TECHNIQUE: Limited ultrasound survey for ascites was performed in all four abdominal quadrants. COMPARISON:  CT abdomen 09/21/2021 FINDINGS: No ascites identified in the 4 quadrants of the abdomen. Again noted  is dilatation of the left renal collecting system and similar to the recent CT findings. IMPRESSION: 1. No ascites. 2. Partial visualization of the known left hydronephrosis.  Electronically Signed   By: Markus Daft M.D.   On: 09/22/2021 13:00   US THORACENTESIS ASP PLEURAL SPACE W/IMG GUIDE  Result Date: 09/23/2021 INDICATION: Shortness of breath, cough, suspected metastatic disease EXAM: ULTRASOUND GUIDED LEFT THORACENTESIS MEDICATIONS: None. COMPLICATIONS: None immediate. PROCEDURE: An ultrasound guided thoracentesis was thoroughly discussed with the patient and questions answered. The benefits, risks, alternatives and complications were also discussed. The patient understands and wishes to proceed with the procedure. Written consent was obtained. Ultrasound was performed to localize and mark an adequate pocket of fluid in the left chest. The area was then prepped and draped in the normal sterile fashion. 1% Lidocaine was used for local anesthesia. Under ultrasound guidance a 6 Fr Safe-T-Centesis catheter was introduced. Thoracentesis was performed. The catheter was removed and a dressing applied. FINDINGS: A total of approximately 825 of yellow fluid was removed. Samples were sent to the laboratory as requested by the clinical team. IMPRESSION: Successful ultrasound guided left thoracentesis yielding 825cc of pleural fluid. Performed and dictated by Pasty Spillers, PA-C Electronically Signed   By: Corrie Mckusick D.O.   On: 09/23/2021 12:24      Scheduled Meds: Continuous Infusions:  sodium chloride 10 mL/hr at 09/21/21 1032     LOS: 1 day      Debbe Odea, MD [facility] Pager: [email or web address] 09/23/2021, 6:33 PM

## 2021-09-23 NOTE — Progress Notes (Signed)
Patient refusing labs. RN to bedside to provide education to patient regarding the importance of trending labs for treatment. Patient verbalizes understanding of the necessity of labs and remains adamant against lab draw. Patient states he came here for shortness of breath, has learned why he is short of breath, and would like that issue to be addressed so he can go home. States he has never expected to live forever and understands his cancer diagnosis but does not wish to seek treatment at this time. Support provided to patient and RN verbalized confirmation his decision. Lab tech updated.

## 2021-09-23 NOTE — Procedures (Signed)
PROCEDURE SUMMARY:  Successful US guided left thoracentesis. Yielded 825cc of yellow pleural fluid. Pt tolerated procedure well. No immediate complications.  Specimen was sent for labs. CXR ordered.  EBL < 5 mL  Dreshawn Hendershott PA-C 09/23/2021 12:12 PM

## 2021-09-23 NOTE — Progress Notes (Signed)
Pt. Continues to refuse labs, pt educated x2 this shift on importance of lab draw. Pt initially stated he would have labs done this am however, pt refused. MD made aware of ongoing refusals of different aspects of care. Pt denied any concerns most of shift and indicates he is ready to go home. Will continue to monitor pt closely. Neomia Dear, RN

## 2021-09-23 NOTE — Progress Notes (Signed)
Pt continues to refuse lab draws despite educating for need.

## 2021-09-24 DIAGNOSIS — N1330 Unspecified hydronephrosis: Secondary | ICD-10-CM | POA: Diagnosis not present

## 2021-09-24 DIAGNOSIS — D696 Thrombocytopenia, unspecified: Secondary | ICD-10-CM

## 2021-09-24 DIAGNOSIS — C7951 Secondary malignant neoplasm of bone: Secondary | ICD-10-CM

## 2021-09-24 DIAGNOSIS — R195 Other fecal abnormalities: Secondary | ICD-10-CM | POA: Diagnosis not present

## 2021-09-24 DIAGNOSIS — J90 Pleural effusion, not elsewhere classified: Secondary | ICD-10-CM | POA: Diagnosis not present

## 2021-09-24 DIAGNOSIS — Z9889 Other specified postprocedural states: Secondary | ICD-10-CM

## 2021-09-24 LAB — MISC LABCORP TEST (SEND OUT): Labcorp test code: 9985

## 2021-09-24 MED ORDER — ONDANSETRON HCL 4 MG PO TABS
4.0000 mg | ORAL_TABLET | Freq: Four times a day (QID) | ORAL | 0 refills | Status: DC | PRN
Start: 2021-09-24 — End: 2021-10-29

## 2021-09-24 MED ORDER — ALBUTEROL SULFATE (2.5 MG/3ML) 0.083% IN NEBU: 2.5000 mg | INHALATION_SOLUTION | RESPIRATORY_TRACT | 12 refills | Status: DC | PRN

## 2021-09-24 NOTE — Progress Notes (Signed)
[id number]  [facility]) Hospital Liaison Note  Notified by Transition of Care Manger of patient/family request for [facility] services at home after discharge. Chart and patient information under review by [facility] physician.   Hospice eligibility confirmed.   Writer spoke with patient to initiate education related to hospice philosophy, services and team approach to care. Brian Cameron verbalized understanding of information given. Per discussion, plan is for discharge to home by private vehicle. Patient will need prescriptions for discharge comfort medications.     DME needs have been discussed, patient currently has the following equipment in the home: none. Patient/family requests the following DME for delivery to the home: portable oxygen tank and concentrator. [place] equipment manager has been notified and will contact DME provider to arrange delivery to the home. Home address has been verified and is correct in the chart. Butch Penny is the family member to contact to arrange time of delivery.     Please call with any questions or concerns.  [facility] [place] [facility] Liaison [phone]

## 2021-09-24 NOTE — Discharge Summary (Addendum)
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  He presented with progressive SOB.  Notes 40-50 lbs weight loss over past few months and has generalized weakness.    CXR > Bibasilar pleural effusions and atelectasis. Diffuse patchy osteosclerosis, highly suspicious for osseous metastatic disease.  Hospital Course:  Principal Problem: Shortness of breath on exertion - This is his main complaint  -IR has removed 825 cc of yellow pleural fluid from the left pleural space and he states his dyspnea has improved although he remains hypoxic to the 80s when ambulating - Repeat chest x-ray shows decreased left-sided effusion and persistent medium right sided effusion - 2 D ECHO > normal EF - mild LVH- could not determine diastolic function- mild-mod TR - ? If effusion are related to underlying malignancy - He is a lifelong smoker and has never had a work-up for emphysema and I have explained to him that his dyspnea/hypoxia may also be related to  emphysema - he will go home with Oxygen- 2 L   Active Problems: Bilateral pleural effusions, pulmonary nodules, 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 skeleton, enlarged prostate with distended bladder and bilateral hydroureteronephrosis to the level of the bladder - PSA 975.26, ALk phos 693 -Urology has evaluated the patient and feels that the hydronephrosis is likely secondary to the enlarged prostate and recommends a Foley catheter-the patient has declined this - The patient deferred a digital rectal aid exam and a prostate biopsy as well - Based on the urology note, given his cachectic state and advanced disease burden, he may be a candidate for hospice- I agree- see discussion below under "disposition".    Cachexia  Hypoalbuminemia Severe protein calorie malnutrition - The patient is cachectic on exam with significant amount of muscle wasting - he has lost over 50 lbs in the past few months - Body mass index is 17.81 kg/m. -Likely related to underlying cancer     Hyperkalemia -Likely related to elevated creatinine & poor renal clearance  Elevated creatinine with metabolic acidosis - Creatinine was 1.37 and has risen to 1.44 - Suspect this is related to enlarged prostate causing urinary obstruction-  he is voiding and overall asymptomatic - again, he refused a Foley catheter at this time   Macrocytic anemia, thrombocytopenia ([place]) - ?  If secondary to chronic alcohol abuse addition to extensive bony mets - Hemoglobin is around 7 and platelets in the 50s Vitamin B12 and iron panel consistent with anemia of chronic disease -Retic count is poor at 2.25     Positive  fecal occult blood test -Follow for now   Nicotine abuse - Smokes cigarettes-started when he was in his 38s has quit for short periods of time but recently has been smoking again - Given NicoDerm patch while in the hospital  Alcohol abuse - No signs of alcohol withdrawal as of yet     Disposition:  Patient is intermittently refusing lab work and has declined many treatments that have been offered to him so far. He does not have a PCP either and will likely be lost to follow-up. We have had a discussion about his long term plans considering that he has metastatic cancer, is rapidly losing weight, has partial urinary outlet obstruction and b/l pleural effusions which may recur quickly. He repeatedly states that he does not want treatments. He appears to comprehend everything I have explained. I have discussed sending him home with hospice who can help with end of life symptoms and he is in agreement with this at this time. I feel he will need transition to hospice home as he lives alone and has no one to care for him.  Have asked TOC to assist him with finding a PCP as well. In the meantime the hospice physician can be is PCP.     Discharge Exam: Vitals:   09/23/21 2202 09/24/21 0454  BP: 121/61 115/63  Pulse: 76 69  Resp: 16 16  Temp: 97.7 F (36.5 C) 97.6 F (36.4 C)  SpO2: 96% 96%   Vitals:   09/23/21 1154 09/23/21 1720 09/23/21 2202 09/24/21 0454  BP: 131/60 (!) 122/59 121/61 115/63  Pulse:  69 76 69  Resp:  $Remo'16 16 16  'XQgkJ$ Temp:  (!) 97.5 F (36.4 C) 97.7 F (36.5 C) 97.6 F (36.4 C)  TempSrc:  Oral Oral Oral  SpO2:  96% 96% 96%  Weight:      Height:        General: Pt is alert, awake, not in acute distress Cardiovascular: RRR, S1/S2 +, no rubs, no gallops Respiratory: CTA bilaterally, no wheezing, no rhonchi Abdominal: Soft, NT, ND, bowel sounds + Extremities: no edema, no cyanosis   Discharge Instructions  Discharge Instructions     Increase activity slowly   Complete by: As directed       Allergies as of 09/24/2021   No Known Allergies      Medication List     TAKE these medications    acetaminophen 500 MG tablet Commonly known as: TYLENOL Take 1,000 mg by mouth every 6 (six) hours as needed for mild pain.   albuterol (2.5 MG/3ML)  0.083% nebulizer solution Commonly known as: PROVENTIL Take 3 mLs (2.5 mg total) by nebulization every 4 (four) hours as needed for wheezing.   BC Fast Pain Relief 845-65 MG Pack Generic drug: Aspirin-Caffeine Take 1 Package by mouth daily as needed (pain).   ondansetron 4 MG tablet Commonly known as: ZOFRAN Take 1 tablet (4 mg total) by mouth every 6 (six) hours as needed for nausea.               Durable Medical Equipment  (From admission, onward)           Start     Ordered   09/24/21 1157  For home use only DME oxygen  Once       Question Answer Comment  Length of Need Lifetime   Mode or (Route) Nasal cannula   Liters per Minute 2   Frequency Continuous (stationary and portable oxygen unit needed)  Oxygen conserving device Yes   Oxygen delivery system Gas      09/24/21 1156            No Known Allergies    DG Chest 2 View  Result Date: 09/21/2021 CLINICAL DATA:  Shortness of breath, cough, weight loss EXAM: CHEST - 2 VIEW COMPARISON:  09/21/2021 FINDINGS: Normal heart size, mediastinal contours, and pulmonary vascularity. Atherosclerotic calcification aorta. Bibasilar effusions and atelectasis. Remaining lungs clear. No pneumothorax. Diffuse osteosclerosis, patchy, highly suspicious for osseous metastatic disease. Biconvex thoracic scoliosis. IMPRESSION: Bibasilar pleural effusions and atelectasis. Diffuse patchy osteosclerosis, highly suspicious for osseous metastatic disease. Electronically Signed   By: Lavonia Dana M.D.   On: 09/21/2021 10:52   CT CHEST W CONTRAST  Result Date: 09/21/2021 CLINICAL DATA:  Short of breath, cough, weight loss, suspected bony metastases on chest x-ray EXAM: CT CHEST, ABDOMEN, AND PELVIS WITH CONTRAST TECHNIQUE: Multidetector CT imaging of the chest, abdomen and pelvis was performed following the standard protocol during bolus administration of intravenous contrast. CONTRAST:  35mL OMNIPAQUE IOHEXOL 350 MG/ML SOLN  COMPARISON:  09/21/2021 FINDINGS: CT CHEST FINDINGS Cardiovascular: The heart and great vessels are unremarkable without pericardial effusion. No evidence of thoracic aortic aneurysm or dissection. Atherosclerosis of the aorta and coronary vasculature. Mediastinum/Nodes: No enlarged mediastinal, hilar, or axillary lymph nodes. Thyroid gland, trachea, and esophagus demonstrate no significant findings. Lungs/Pleura: Moderate bilateral pleural effusions estimated volume in excess of 1 L each. There is dependent consolidation within the lower lobes compatible with compressive atelectasis. No acute airspace disease. No pneumothorax. Mild upper lobe predominant emphysema. Central airways are patent. Indeterminate 9 x 7 x 8 mm right middle lobe pulmonary nodule, reference image 148/5. Musculoskeletal: The bones are diffusely sclerotic, consistent with diffuse bony metastases. Chronic appearing right lateral eleventh rib fracture. No other displaced fractures. CT ABDOMEN PELVIS FINDINGS Hepatobiliary: No focal liver abnormality is seen. No gallstones, gallbladder wall thickening, or biliary dilatation. Pancreas: Unremarkable. No pancreatic ductal dilatation or surrounding inflammatory changes. Spleen: Normal in size without focal abnormality. Adrenals/Urinary Tract: Mild bilateral renal cortical atrophy, right greater than left. There is bilateral obstructive uropathy, as well as marked distension of the urinary bladder with multiple trabeculations. Findings are most consistent with chronic bladder outlet obstruction. Dependent calcifications within the bladder consistent with small cysts to let us. Largest measures 4 mm. Nonspecific mural calcification along and anterior trabeculation, measuring 7 mm reference image 120/3. Adrenals appear unremarkable. Stomach/Bowel: No bowel obstruction or ileus. No bowel wall thickening or inflammatory changes. Vascular/Lymphatic: Extensive atherosclerosis throughout the aorta and its  branches. No pathologic adenopathy within the abdomen or pelvis. Reproductive: The prostate is enlarged and somewhat heterogeneous, measuring approximately 5.4 x 5.1 by 5.1 cm. Other: No free fluid or free intraperitoneal gas. No abdominal wall hernia. Musculoskeletal: Visualized bony structures are diffusely sclerotic, consistent with diffuse metastatic disease. There are no pathologic fractures. Bilateral hip osteoarthritis, right greater than left. Reconstructed images demonstrate no additional findings. IMPRESSION: 1. Diffuse bony sclerosis throughout the axial and visualized appendicular skeleton, consistent with widespread sclerotic metastatic disease. In a patient of this age, prostate cancer would be the diagnosis of exclusion. 2. Marked enlargement of the prostate. Please correlate with serum PSA for evidence of underlying prostate cancer. 3. Nonspecific right middle lobe pulmonary nodule with mean diameter of 8 mm. Primary malignancy or metastatic disease is suspected given associated bony findings. 4. Bilateral hydronephrosis, hydroureter, and distended urinary bladder with multiple trabeculations, consistent with chronic bladder outlet obstruction. Nonobstructing bladder calculi  and nonspecific bladder mural calcifications as above. 5. Moderate bilateral pleural effusions and compressive lower lobe atelectasis. 6. Aortic Atherosclerosis (ICD10-I70.0) and Emphysema (ICD10-J43.9). Electronically Signed   By: Randa Ngo M.D.   On: 09/21/2021 19:15   CT ABDOMEN PELVIS W CONTRAST  Result Date: 09/21/2021 CLINICAL DATA:  Short of breath, cough, weight loss, suspected bony metastases on chest x-ray EXAM: CT CHEST, ABDOMEN, AND PELVIS WITH CONTRAST TECHNIQUE: Multidetector CT imaging of the chest, abdomen and pelvis was performed following the standard protocol during bolus administration of intravenous contrast. CONTRAST:  31mL OMNIPAQUE IOHEXOL 350 MG/ML SOLN COMPARISON:  09/21/2021 FINDINGS: CT CHEST  FINDINGS Cardiovascular: The heart and great vessels are unremarkable without pericardial effusion. No evidence of thoracic aortic aneurysm or dissection. Atherosclerosis of the aorta and coronary vasculature. Mediastinum/Nodes: No enlarged mediastinal, hilar, or axillary lymph nodes. Thyroid gland, trachea, and esophagus demonstrate no significant findings. Lungs/Pleura: Moderate bilateral pleural effusions estimated volume in excess of 1 L each. There is dependent consolidation within the lower lobes compatible with compressive atelectasis. No acute airspace disease. No pneumothorax. Mild upper lobe predominant emphysema. Central airways are patent. Indeterminate 9 x 7 x 8 mm right middle lobe pulmonary nodule, reference image 148/5. Musculoskeletal: The bones are diffusely sclerotic, consistent with diffuse bony metastases. Chronic appearing right lateral eleventh rib fracture. No other displaced fractures. CT ABDOMEN PELVIS FINDINGS Hepatobiliary: No focal liver abnormality is seen. No gallstones, gallbladder wall thickening, or biliary dilatation. Pancreas: Unremarkable. No pancreatic ductal dilatation or surrounding inflammatory changes. Spleen: Normal in size without focal abnormality. Adrenals/Urinary Tract: Mild bilateral renal cortical atrophy, right greater than left. There is bilateral obstructive uropathy, as well as marked distension of the urinary bladder with multiple trabeculations. Findings are most consistent with chronic bladder outlet obstruction. Dependent calcifications within the bladder consistent with small cysts to let us. Largest measures 4 mm. Nonspecific mural calcification along and anterior trabeculation, measuring 7 mm reference image 120/3. Adrenals appear unremarkable. Stomach/Bowel: No bowel obstruction or ileus. No bowel wall thickening or inflammatory changes. Vascular/Lymphatic: Extensive atherosclerosis throughout the aorta and its branches. No pathologic adenopathy within the  abdomen or pelvis. Reproductive: The prostate is enlarged and somewhat heterogeneous, measuring approximately 5.4 x 5.1 by 5.1 cm. Other: No free fluid or free intraperitoneal gas. No abdominal wall hernia. Musculoskeletal: Visualized bony structures are diffusely sclerotic, consistent with diffuse metastatic disease. There are no pathologic fractures. Bilateral hip osteoarthritis, right greater than left. Reconstructed images demonstrate no additional findings. IMPRESSION: 1. Diffuse bony sclerosis throughout the axial and visualized appendicular skeleton, consistent with widespread sclerotic metastatic disease. In a patient of this age, prostate cancer would be the diagnosis of exclusion. 2. Marked enlargement of the prostate. Please correlate with serum PSA for evidence of underlying prostate cancer. 3. Nonspecific right middle lobe pulmonary nodule with mean diameter of 8 mm. Primary malignancy or metastatic disease is suspected given associated bony findings. 4. Bilateral hydronephrosis, hydroureter, and distended urinary bladder with multiple trabeculations, consistent with chronic bladder outlet obstruction. Nonobstructing bladder calculi and nonspecific bladder mural calcifications as above. 5. Moderate bilateral pleural effusions and compressive lower lobe atelectasis. 6. Aortic Atherosclerosis (ICD10-I70.0) and Emphysema (ICD10-J43.9). Electronically Signed   By: Randa Ngo M.D.   On: 09/21/2021 19:15   DG CHEST PORT 1 VIEW  Result Date: 09/23/2021 CLINICAL DATA:  Status post thoracentesis EXAM: PORTABLE CHEST 1 VIEW COMPARISON:  Chest radiograph and chest CT 09/21/2021 FINDINGS: The cardiomediastinal silhouette is stable. The left pleural effusion has decreased in size  following thoracentesis. Aeration of the left lower lobe has improved. There is no pneumothorax. The small to medium size right pleural effusion is not significantly changed. Aeration of the right lung is not significantly changed.  There is no right pneumothorax. Diffuse osseous sclerosis is unchanged. IMPRESSION: 1. Decreased size of the left pleural effusion following thoracentesis. No pneumothorax. 2. Unchanged right pleural effusion. Electronically Signed   By: Valetta Mole M.D.   On: 09/23/2021 12:22   ECHOCARDIOGRAM COMPLETE  Result Date: 09/21/2021    ECHOCARDIOGRAM REPORT   Patient Name:   Brian Cameron Date of Exam: 09/21/2021 Medical Rec #:  [id number]       Height:       73.0 in Accession #:    [id number]      Weight:       135.0 lb Date of Birth:  1946/02/29        BSA:          1.821 m Patient Age:    26 years        BP:           155/53 mmHg Patient Gender: M               HR:           75 bpm. Exam Location:  Inpatient Procedure: 2D Echo, Cardiac Doppler and Color Doppler Indications:    Abnormal ECG R94.31  History:        Patient has no prior history of Echocardiogram examinations.                 Abnormal ECG.  Sonographer:    Merrie Roof RDCS Referring Phys: [id number] [place]  Sonographer Comments: No apical window. Too skinny. Won't or can't lay on side IMPRESSIONS  1. Left ventricular ejection fraction, by estimation, is 55 to 60%. The left ventricle has normal function. The left ventricle has no regional wall motion abnormalities. There is mild concentric left ventricular hypertrophy. Left ventricular diastolic function could not be evaluated.  2. Right ventricular systolic function is normal. The right ventricular size is normal. There is mildly elevated pulmonary artery systolic pressure.  3. Left atrial size was mildly dilated.  4. The mitral valve is normal in structure. Trivial mitral valve regurgitation.  5. Tricuspid valve regurgitation is mild to moderate.  6. The aortic valve is tricuspid. There is mild calcification of the aortic valve. There is mild thickening of the aortic valve. Aortic valve regurgitation is mild to moderate. Aortic valve sclerosis is present, with no evidence of aortic valve  stenosis. FINDINGS  Left Ventricle: Left ventricular ejection fraction, by estimation, is 55 to 60%. The left ventricle has normal function. The left ventricle has no regional wall motion abnormalities. The left ventricular internal cavity size was normal in size. There is  mild concentric left ventricular hypertrophy. Left ventricular diastolic function could not be evaluated. Right Ventricle: The right ventricular size is normal. No increase in right ventricular wall thickness. Right ventricular systolic function is normal. There is mildly elevated pulmonary artery systolic pressure. The tricuspid regurgitant velocity is 3.19  m/s, and with an assumed right atrial pressure of 3 mmHg, the estimated right ventricular systolic pressure is 30.8 mmHg. Left Atrium: Left atrial size was mildly dilated. Right Atrium: Right atrial size was normal in size. Prominent Chiari network. Pericardium: There is no evidence of pericardial effusion. Mitral Valve: The mitral valve is normal in structure. Trivial mitral valve regurgitation. Tricuspid Valve: The  tricuspid valve is normal in structure. Tricuspid valve regurgitation is mild to moderate. Aortic Valve: The aortic valve is tricuspid. There is mild calcification of the aortic valve. There is mild thickening of the aortic valve. Aortic valve regurgitation is mild to moderate. Aortic valve sclerosis is present, with no evidence of aortic valve stenosis. Pulmonic Valve: The pulmonic valve was grossly normal. Pulmonic valve regurgitation is trivial. Aorta: The aortic root is normal in size and structure. IAS/Shunts: The interatrial septum was not well visualized.  LEFT VENTRICLE PLAX 2D LVIDd:         4.20 cm LVIDs:         3.00 cm LV PW:         1.20 cm LV IVS:        1.10 cm LVOT diam:     2.23 cm LVOT Area:     3.91 cm  LEFT ATRIUM         Index LA diam:    4.40 cm 2.42 cm/m   AORTA Ao Root diam: 3.20 cm TRICUSPID VALVE TR Peak grad:   40.7 mmHg TR Vmax:        319.00 cm/s   SHUNTS Systemic Diam: 2.23 cm Dani Gobble Croitoru MD Electronically signed by Sanda Klein MD Signature Date/Time: 09/21/2021/4:49:54 PM    Final    [place] ASCITES (ABDOMEN LIMITED)  Result Date: 09/22/2021 CLINICAL DATA:  Evaluate for ascites. EXAM: LIMITED ABDOMEN ULTRASOUND FOR ASCITES TECHNIQUE: Limited ultrasound survey for ascites was performed in all four abdominal quadrants. COMPARISON:  CT abdomen 09/21/2021 FINDINGS: No ascites identified in the 4 quadrants of the abdomen. Again noted is dilatation of the left renal collecting system and similar to the recent CT findings. IMPRESSION: 1. No ascites. 2. Partial visualization of the known left hydronephrosis. Electronically Signed   By: Markus Daft M.D.   On: 09/22/2021 13:00   US THORACENTESIS ASP PLEURAL SPACE W/IMG GUIDE  Result Date: 09/23/2021 INDICATION: Shortness of breath, cough, suspected metastatic disease EXAM: ULTRASOUND GUIDED LEFT THORACENTESIS MEDICATIONS: None. COMPLICATIONS: None immediate. PROCEDURE: An ultrasound guided thoracentesis was thoroughly discussed with the patient and questions answered. The benefits, risks, alternatives and complications were also discussed. The patient understands and wishes to proceed with the procedure. Written consent was obtained. Ultrasound was performed to localize and mark an adequate pocket of fluid in the left chest. The area was then prepped and draped in the normal sterile fashion. 1% Lidocaine was used for local anesthesia. Under ultrasound guidance a 6 Fr Safe-T-Centesis catheter was introduced. Thoracentesis was performed. The catheter was removed and a dressing applied. FINDINGS: A total of approximately 825 of yellow fluid was removed. Samples were sent to the laboratory as requested by the clinical team. IMPRESSION: Successful ultrasound guided left thoracentesis yielding 825cc of pleural fluid. Performed and dictated by Pasty Spillers, PA-C Electronically Signed   By: Corrie Mckusick D.O.    On: 09/23/2021 12:24     The results of significant diagnostics from this hospitalization (including imaging, microbiology, ancillary and laboratory) are listed below for reference.     Microbiology: Recent Results (from the past 240 hour(s))  Resp Panel by RT-PCR (Flu A&B, Covid) Nasopharyngeal Swab     Status: None   Collection Time: 09/21/21 11:18 AM   Specimen: Nasopharyngeal Swab; Nasopharyngeal(NP) swabs in vial transport medium  Result Value Ref Range Status   SARS Coronavirus 2 by RT PCR NEGATIVE NEGATIVE Final    Comment: (NOTE) SARS-CoV-2 target nucleic acids are NOT DETECTED.  The  SARS-CoV-2 RNA is generally detectable in upper respiratory specimens during the acute phase of infection. The lowest concentration of SARS-CoV-2 viral copies this assay can detect is 138 copies/mL. A negative result does not preclude SARS-Cov-2 infection and should not be used as the sole basis for treatment or other patient management decisions. A negative result may occur with  improper specimen collection/handling, submission of specimen other than nasopharyngeal swab, presence of viral mutation(s) within the areas targeted by this assay, and inadequate number of viral copies(<138 copies/mL). A negative result must be combined with clinical observations, patient history, and epidemiological information. The expected result is Negative.  Fact Sheet for Patients:  EntrepreneurPulse.com.au  Fact Sheet for Healthcare Providers:  IncredibleEmployment.be  This test is no t yet approved or cleared by the [place] FDA and  has been authorized for detection and/or diagnosis of SARS-CoV-2 by FDA under an Emergency Use Authorization (EUA). This EUA will remain  in effect (meaning this test can be used) for the duration of the COVID-19 declaration under Section 564(b)(1) of the Act, 21 U.S.C.section 360bbb-3(b)(1), unless the authorization is terminated  or  revoked sooner.       Influenza A by PCR NEGATIVE NEGATIVE Final   Influenza B by PCR NEGATIVE NEGATIVE Final    Comment: (NOTE) The Xpert Xpress SARS-CoV-2/FLU/RSV plus assay is intended as an aid in the diagnosis of influenza from Nasopharyngeal swab specimens and should not be used as a sole basis for treatment. Nasal washings and aspirates are unacceptable for Xpert Xpress SARS-CoV-2/FLU/RSV testing.  Fact Sheet for Patients: EntrepreneurPulse.com.au  Fact Sheet for Healthcare Providers: IncredibleEmployment.be  This test is not yet approved or cleared by the [place] FDA and has been authorized for detection and/or diagnosis of SARS-CoV-2 by FDA under an Emergency Use Authorization (EUA). This EUA will remain in effect (meaning this test can be used) for the duration of the COVID-19 declaration under Section 564(b)(1) of the Act, 21 U.S.C. section 360bbb-3(b)(1), unless the authorization is terminated or revoked.  Performed at [facility], [place] [address] [place], [place] [address]   Body fluid culture w Gram Stain     Status: None (Preliminary result)   Collection Time: 09/23/21 11:36 AM   Specimen: PATH Cytology Pleural fluid  Result Value Ref Range Status   Specimen Description   Final    PLEURAL LT Performed at [place] [address], [place], [place] [address]    Special Requests   Final    NONE Performed at [facility], [place] [address], [place], [place] [address]    Gram Stain   Final    WBC PRESENT,BOTH PMN AND MONONUCLEAR NO ORGANISMS SEEN CYTOSPIN SMEAR    Culture   Final    NO GROWTH < 24 HOURS Performed at [facility], [place] [address], [place], [place] [address]    Report Status PENDING  Incomplete     Labs: BNP (last 3 results) Recent Labs    09/21/21 1030  BNP 867.6*   Basic Metabolic Panel: Recent Labs  Lab 09/21/21 1030  09/21/21 1826 09/22/21 0523  NA 140 140 139  K 5.4* 5.4* 5.4*  CL 117* 113* 113*  CO2 17* 20* 20*  GLUCOSE 97 98 96  BUN 29* 35* 33*  CREATININE 1.37* 1.43* 1.44*  CALCIUM 7.9* 7.9* 7.9*   Liver Function Tests: Recent Labs  Lab 09/21/21 1030 09/22/21 0523  AST 40 37  ALT 21 19  ALKPHOS  683* 693*  BILITOT 0.3 0.5  PROT 5.9* 5.9*  ALBUMIN 3.0* 2.9*   No results for input(s): LIPASE, AMYLASE in the last 168 hours. No results for input(s): AMMONIA in the last 168 hours. CBC: Recent Labs  Lab 09/21/21 1030 09/21/21 1826 09/22/21 0523  WBC 8.1  --  7.5  NEUTROABS 2.5  --   --   HGB 7.7* 7.0* 7.0*  HCT 26.2* 23.7* 22.8*  MCV 108.7*  --  106.0*  PLT 52*  --  50*   Cardiac Enzymes: No results for input(s): CKTOTAL, CKMB, CKMBINDEX, TROPONINI in the last 168 hours. BNP: Invalid input(s): POCBNP CBG: No results for input(s): GLUCAP in the last 168 hours. D-Dimer No results for input(s): DDIMER in the last 72 hours. Hgb A1c No results for input(s): HGBA1C in the last 72 hours. Lipid Profile No results for input(s): CHOL, HDL, LDLCALC, TRIG, CHOLHDL, LDLDIRECT in the last 72 hours. Thyroid function studies No results for input(s): TSH, T4TOTAL, T3FREE, THYROIDAB in the last 72 hours.  Invalid input(s): FREET3 Anemia work up Recent Labs    09/21/21 1300  RETICCTPCT 2.9   Urinalysis No results found for: COLORURINE, APPEARANCEUR, [place], [place], [place], [place], [place], [place], PROTEINUR, UROBILINOGEN, NITRITE, LEUKOCYTESUR Sepsis Labs Invalid input(s): PROCALCITONIN,  WBC,  LACTICIDVEN Microbiology Recent Results (from the past 240 hour(s))  Resp Panel by RT-PCR (Flu A&B, Covid) Nasopharyngeal Swab     Status: None   Collection Time: 09/21/21 11:18 AM   Specimen: Nasopharyngeal Swab; Nasopharyngeal(NP) swabs in vial transport medium  Result Value Ref Range Status   SARS Coronavirus 2 by RT PCR NEGATIVE NEGATIVE Final    Comment: (NOTE) SARS-CoV-2  target nucleic acids are NOT DETECTED.  The SARS-CoV-2 RNA is generally detectable in upper respiratory specimens during the acute phase of infection. The lowest concentration of SARS-CoV-2 viral copies this assay can detect is 138 copies/mL. A negative result does not preclude SARS-Cov-2 infection and should not be used as the sole basis for treatment or other patient management decisions. A negative result may occur with  improper specimen collection/handling, submission of specimen other than nasopharyngeal swab, presence of viral mutation(s) within the areas targeted by this assay, and inadequate number of viral copies(<138 copies/mL). A negative result must be combined with clinical observations, patient history, and epidemiological information. The expected result is Negative.  Fact Sheet for Patients:  EntrepreneurPulse.com.au  Fact Sheet for Healthcare Providers:  IncredibleEmployment.be  This test is no t yet approved or cleared by the [place] FDA and  has been authorized for detection and/or diagnosis of SARS-CoV-2 by FDA under an Emergency Use Authorization (EUA). This EUA will remain  in effect (meaning this test can be used) for the duration of the COVID-19 declaration under Section 564(b)(1) of the Act, 21 U.S.C.section 360bbb-3(b)(1), unless the authorization is terminated  or revoked sooner.       Influenza A by PCR NEGATIVE NEGATIVE Final   Influenza B by PCR NEGATIVE NEGATIVE Final    Comment: (NOTE) The Xpert Xpress SARS-CoV-2/FLU/RSV plus assay is intended as an aid in the diagnosis of influenza from Nasopharyngeal swab specimens and should not be used as a sole basis for treatment. Nasal washings and aspirates are unacceptable for Xpert Xpress SARS-CoV-2/FLU/RSV testing.  Fact Sheet for Patients: EntrepreneurPulse.com.au  Fact Sheet for Healthcare  Providers: IncredibleEmployment.be  This test is not yet approved or cleared by the [place] FDA and has been authorized for detection and/or diagnosis of SARS-CoV-2 by FDA under an  Emergency Use Authorization (EUA). This EUA will remain in effect (meaning this test can be used) for the duration of the COVID-19 declaration under Section 564(b)(1) of the Act, 21 U.S.C. section 360bbb-3(b)(1), unless the authorization is terminated or revoked.  Performed at [facility], [place] [address], [place], [place] [address]   Body fluid culture w Gram Stain     Status: None (Preliminary result)   Collection Time: 09/23/21 11:36 AM   Specimen: PATH Cytology Pleural fluid  Result Value Ref Range Status   Specimen Description   Final    PLEURAL LT Performed at [place] [address] [place], [place] [address]    Special Requests   Final    NONE Performed at [facility], [place] [address] [place], [place] [address]    Gram Stain   Final    WBC PRESENT,BOTH PMN AND MONONUCLEAR NO ORGANISMS SEEN CYTOSPIN SMEAR    Culture   Final    NO GROWTH < 24 HOURS Performed at [facility], [place] [address] [place], [place] [address]    Report Status PENDING  Incomplete     Time coordinating discharge in minutes: 65  SIGNED:   Debbe Odea, MD  [facility] 09/24/2021, 12:06 PM

## 2021-09-24 NOTE — Progress Notes (Signed)
SATURATION QUALIFICATIONS: (This note is used to comply with regulatory documentation for home oxygen)  Patient Saturations on Room Air at Rest = 97%  Patient Saturations on Room Air while Ambulating = 83%  Patient Saturations on 2 Liters of oxygen while Ambulating = 98%  Please briefly explain why patient needs home oxygen: Desaturation to low 80's upon ambulation.

## 2021-09-24 NOTE — TOC Initial Note (Signed)
Transition of Care [facility]) - Initial/Assessment Note    Patient Details  Name: Brian Cameron MRN: [id number] Date of Birth: 1946-09-12  Transition of Care [facility]) CM/SW Contact:    Lynnell Catalan, RN Phone Number: 09/24/2021, 12:40 PM  Clinical Narrative:                 [facility] referral for home with hospice services. Spoke with pt to offer choice of home hospice and [facility] chosen. [facility] liaison contacted for referral. Pt declines any need for DME other than home 02. Shanita with [facility] alerted that pt will need 02 to travel home.  Expected Discharge Plan: Home w Hospice Care Barriers to Discharge: No Barriers Identified   Patient Goals and CMS Choice     Choice offered to / list presented to : Patient  Expected Discharge Plan and Services Expected Discharge Plan: [place]   Discharge Planning Services: CM Consult Post Acute Care Choice: Hospice Living arrangements for the past 2 months: Single Family Home Expected Discharge Date: 09/24/21                         [facility] Arranged: Disease Management [place] Agency: Hospice and [place] Date [place]: 09/24/21 Time HH Agency Contacted: 3 Representative spoke with at [place]: [place]  Prior Living Arrangements/Services Living arrangements for the past 2 months: [place] with:: Self Patient language and need for interpreter reviewed:: Yes Do you feel safe going back to the place where you live?: Yes      Need for Family Participation in Patient Care: Yes (Comment) Care giver support system in place?: Yes (comment)   Criminal Activity/Legal Involvement Pertinent to Current Situation/Hospitalization: No - Comment as needed  Activities of Daily Living Home Assistive Devices/Equipment: None ADL Screening (condition at time of admission) Patient's cognitive ability adequate to safely complete daily activities?: Yes Is the patient deaf or have difficulty  hearing?: No Does the patient have difficulty seeing, even when wearing glasses/contacts?: No Does the patient have difficulty concentrating, remembering, or making decisions?: No Patient able to express need for assistance with ADLs?: No Does the patient have difficulty dressing or bathing?: No Independently performs ADLs?: Yes (appropriate for developmental age) Does the patient have difficulty walking or climbing stairs?: No Weakness of Legs: None Weakness of Arms/Hands: None  Permission Sought/Granted Permission sought to share information with : Facility Art therapist granted to share information with : Yes, Verbal Permission Granted     Permission granted to share info w AGENCY: [facility]        Emotional Assessment   Attitude/Demeanor/Rapport: Gracious Affect (typically observed): Calm Orientation: : Oriented to Place, Oriented to  Time, Oriented to Situation, Oriented to Self Alcohol / Substance Use: Not Applicable Psych Involvement: No (comment)  Admission diagnosis:  Bilateral pleural effusion [J90] Dyspnea, unspecified type [R06.00] Patient Active Problem List   Diagnosis Date Noted   Metastatic cancer ([place]) 09/22/2021   AKI (acute kidney injury) ([place]) 09/22/2021   Bilateral hydronephrosis 09/22/2021   Bilateral pleural effusion 09/21/2021   Macrocytic anemia 09/21/2021   Hyperkalemia 09/21/2021   Hypoalbuminemia 09/21/2021   Thrombocytopenia ([place]) 09/21/2021   Positive fecal occult blood test 09/21/2021   PCP:  Pcp, No Pharmacy:   [facility] [place], [place] [address] [place] [address] Phone: [phone] Fax: [phone]  Social Determinants of Health (SDOH) Interventions    Readmission Risk Interventions Readmission Risk Prevention Plan 09/24/2021  Transportation Screening Complete  PCP or Specialist Appt within 5-7 Days  Complete  Home Care Screening Complete  Medication Review (RN CM) Complete  Some recent data might be hidden

## 2021-09-25 LAB — CYTOLOGY - NON PAP

## 2021-09-26 LAB — BODY FLUID CULTURE W GRAM STAIN: Culture: NO GROWTH

## 2021-10-27 ENCOUNTER — Emergency Department (HOSPITAL_COMMUNITY)

## 2021-10-27 ENCOUNTER — Inpatient Hospital Stay (HOSPITAL_COMMUNITY)
Admission: EM | Admit: 2021-10-27 | Discharge: 2021-10-29 | DRG: 064 | Disposition: A | Discharge status: Hospice | Attending: Internal Medicine | Admitting: Internal Medicine

## 2021-10-27 DIAGNOSIS — F17210 Nicotine dependence, cigarettes, uncomplicated: Secondary | ICD-10-CM | POA: Diagnosis present

## 2021-10-27 DIAGNOSIS — C7951 Secondary malignant neoplasm of bone: Secondary | ICD-10-CM | POA: Diagnosis present

## 2021-10-27 DIAGNOSIS — J90 Pleural effusion, not elsewhere classified: Principal | ICD-10-CM | POA: Diagnosis present

## 2021-10-27 DIAGNOSIS — G9341 Metabolic encephalopathy: Secondary | ICD-10-CM | POA: Diagnosis present

## 2021-10-27 DIAGNOSIS — N1831 Chronic kidney disease, stage 3a: Secondary | ICD-10-CM

## 2021-10-27 DIAGNOSIS — Z66 Do not resuscitate: Secondary | ICD-10-CM | POA: Diagnosis present

## 2021-10-27 DIAGNOSIS — L8990 Pressure ulcer of unspecified site, unspecified stage: Secondary | ICD-10-CM | POA: Insufficient documentation

## 2021-10-27 DIAGNOSIS — G9340 Encephalopathy, unspecified: Secondary | ICD-10-CM | POA: Diagnosis not present

## 2021-10-27 DIAGNOSIS — E872 Acidosis, unspecified: Secondary | ICD-10-CM | POA: Diagnosis present

## 2021-10-27 DIAGNOSIS — Z20822 Contact with and (suspected) exposure to covid-19: Secondary | ICD-10-CM | POA: Diagnosis present

## 2021-10-27 DIAGNOSIS — Z8249 Family history of ischemic heart disease and other diseases of the circulatory system: Secondary | ICD-10-CM

## 2021-10-27 DIAGNOSIS — N1330 Unspecified hydronephrosis: Secondary | ICD-10-CM | POA: Diagnosis not present

## 2021-10-27 DIAGNOSIS — Z681 Body mass index (BMI) 19 or less, adult: Secondary | ICD-10-CM

## 2021-10-27 DIAGNOSIS — R29700 NIHSS score 0: Secondary | ICD-10-CM | POA: Diagnosis present

## 2021-10-27 DIAGNOSIS — D6959 Other secondary thrombocytopenia: Secondary | ICD-10-CM | POA: Diagnosis present

## 2021-10-27 DIAGNOSIS — I63531 Cerebral infarction due to unspecified occlusion or stenosis of right posterior cerebral artery: Principal | ICD-10-CM | POA: Diagnosis present

## 2021-10-27 DIAGNOSIS — N320 Bladder-neck obstruction: Secondary | ICD-10-CM | POA: Diagnosis present

## 2021-10-27 DIAGNOSIS — I5033 Acute on chronic diastolic (congestive) heart failure: Secondary | ICD-10-CM | POA: Diagnosis present

## 2021-10-27 DIAGNOSIS — Z801 Family history of malignant neoplasm of trachea, bronchus and lung: Secondary | ICD-10-CM

## 2021-10-27 DIAGNOSIS — G936 Cerebral edema: Secondary | ICD-10-CM | POA: Diagnosis present

## 2021-10-27 DIAGNOSIS — D539 Nutritional anemia, unspecified: Secondary | ICD-10-CM | POA: Diagnosis present

## 2021-10-27 DIAGNOSIS — C799 Secondary malignant neoplasm of unspecified site: Secondary | ICD-10-CM | POA: Diagnosis present

## 2021-10-27 DIAGNOSIS — N179 Acute kidney failure, unspecified: Secondary | ICD-10-CM | POA: Diagnosis not present

## 2021-10-27 DIAGNOSIS — R0600 Dyspnea, unspecified: Secondary | ICD-10-CM

## 2021-10-27 DIAGNOSIS — Z515 Encounter for palliative care: Secondary | ICD-10-CM

## 2021-10-27 DIAGNOSIS — D696 Thrombocytopenia, unspecified: Secondary | ICD-10-CM | POA: Diagnosis present

## 2021-10-27 DIAGNOSIS — N35919 Unspecified urethral stricture, male, unspecified site: Secondary | ICD-10-CM | POA: Diagnosis present

## 2021-10-27 DIAGNOSIS — E43 Unspecified severe protein-calorie malnutrition: Secondary | ICD-10-CM | POA: Diagnosis present

## 2021-10-27 DIAGNOSIS — R64 Cachexia: Secondary | ICD-10-CM | POA: Diagnosis present

## 2021-10-27 DIAGNOSIS — R748 Abnormal levels of other serum enzymes: Secondary | ICD-10-CM | POA: Diagnosis present

## 2021-10-27 DIAGNOSIS — D630 Anemia in neoplastic disease: Secondary | ICD-10-CM | POA: Diagnosis present

## 2021-10-27 DIAGNOSIS — C61 Malignant neoplasm of prostate: Secondary | ICD-10-CM | POA: Diagnosis present

## 2021-10-27 HISTORY — DX: Malignant neoplasm of prostate: C61

## 2021-10-27 HISTORY — DX: Secondary malignant neoplasm of unspecified site: C79.9

## 2021-10-27 LAB — COMPREHENSIVE METABOLIC PANEL
ALT: 12 U/L (ref 0–44)
AST: 37 U/L (ref 15–41)
Albumin: 2.5 g/dL — ABNORMAL LOW (ref 3.5–5.0)
Alkaline Phosphatase: 422 U/L — ABNORMAL HIGH (ref 38–126)
Anion gap: 11 (ref 5–15)
BUN: 30 mg/dL — ABNORMAL HIGH (ref 8–23)
CO2: 16 mmol/L — ABNORMAL LOW (ref 22–32)
Calcium: 8.1 mg/dL — ABNORMAL LOW (ref 8.9–10.3)
Chloride: 117 mmol/L — ABNORMAL HIGH (ref 98–111)
Creatinine, Ser: 1.84 mg/dL — ABNORMAL HIGH (ref 0.61–1.24)
GFR, Estimated: 37 mL/min — ABNORMAL LOW (ref >60)
Glucose, Bld: 110 mg/dL — ABNORMAL HIGH (ref 70–99)
Potassium: 4.6 mmol/L (ref 3.5–5.1)
Sodium: 144 mmol/L (ref 135–145)
Total Bilirubin: 0.4 mg/dL (ref 0.3–1.2)
Total Protein: 5.2 g/dL — ABNORMAL LOW (ref 6.5–8.1)

## 2021-10-27 LAB — URINALYSIS, MICROSCOPIC (REFLEX)

## 2021-10-27 LAB — RESP PANEL BY RT-PCR (FLU A&B, COVID) ARPGX2
Influenza A by PCR: NEGATIVE
Influenza B by PCR: NEGATIVE
SARS Coronavirus 2 by RT PCR: NEGATIVE

## 2021-10-27 LAB — URINALYSIS, ROUTINE W REFLEX MICROSCOPIC
Bilirubin Urine: NEGATIVE
Glucose, UA: NEGATIVE mg/dL
Ketones, ur: NEGATIVE mg/dL
Leukocytes,Ua: NEGATIVE
Nitrite: NEGATIVE
Protein, ur: NEGATIVE mg/dL
Specific Gravity, Urine: 1.02 (ref 1.005–1.030)
pH: 5.5 (ref 5.0–8.0)

## 2021-10-27 LAB — RAPID URINE DRUG SCREEN, HOSP PERFORMED
Amphetamines: NOT DETECTED
Barbiturates: NOT DETECTED
Benzodiazepines: NOT DETECTED
Cocaine: NOT DETECTED
Opiates: POSITIVE — AB
Tetrahydrocannabinol: NOT DETECTED

## 2021-10-27 LAB — I-STAT CHEM 8, ED
BUN: 29 mg/dL — ABNORMAL HIGH (ref 8–23)
Calcium, Ion: 1.1 mmol/L — ABNORMAL LOW (ref 1.15–1.40)
Chloride: 117 mmol/L — ABNORMAL HIGH (ref 98–111)
Creatinine, Ser: 1.8 mg/dL — ABNORMAL HIGH (ref 0.61–1.24)
Glucose, Bld: 105 mg/dL — ABNORMAL HIGH (ref 70–99)
HCT: 22 % — ABNORMAL LOW (ref 39.0–52.0)
Hemoglobin: 7.5 g/dL — ABNORMAL LOW (ref 13.0–17.0)
Potassium: 4.6 mmol/L (ref 3.5–5.1)
Sodium: 143 mmol/L (ref 135–145)
TCO2: 21 mmol/L — ABNORMAL LOW (ref 22–32)

## 2021-10-27 LAB — CBC WITH DIFFERENTIAL/PLATELET
Abs Immature Granulocytes: 0 10*3/uL (ref 0.00–0.07)
Basophils Absolute: 0.1 10*3/uL (ref 0.0–0.1)
Basophils Relative: 2 %
Eosinophils Absolute: 0 10*3/uL (ref 0.0–0.5)
Eosinophils Relative: 0 %
HCT: 25.6 % — ABNORMAL LOW (ref 39.0–52.0)
Hemoglobin: 7.5 g/dL — ABNORMAL LOW (ref 13.0–17.0)
Lymphocytes Relative: 21 %
Lymphs Abs: 1.4 10*3/uL (ref 0.7–4.0)
MCH: 31.3 pg (ref 26.0–34.0)
MCHC: 29.3 g/dL — ABNORMAL LOW (ref 30.0–36.0)
MCV: 106.7 fL — ABNORMAL HIGH (ref 80.0–100.0)
Monocytes Absolute: 0.5 10*3/uL (ref 0.1–1.0)
Monocytes Relative: 7 %
Neutro Abs: 4.6 10*3/uL (ref 1.7–7.7)
Neutrophils Relative %: 70 %
Platelets: 22 10*3/uL — CL (ref 150–400)
RBC: 2.4 MIL/uL — ABNORMAL LOW (ref 4.22–5.81)
RDW: 18.8 % — ABNORMAL HIGH (ref 11.5–15.5)
WBC: 6.6 10*3/uL (ref 4.0–10.5)
nRBC: 3.2 % — ABNORMAL HIGH (ref 0.0–0.2)
nRBC: 6 /100 WBC — ABNORMAL HIGH

## 2021-10-27 LAB — ACETAMINOPHEN LEVEL: Acetaminophen (Tylenol), Serum: <10 ug/mL — ABNORMAL LOW (ref 10–30)

## 2021-10-27 LAB — PROTIME-INR
INR: 1.2 (ref 0.8–1.2)
Prothrombin Time: 15.2 seconds (ref 11.4–15.2)

## 2021-10-27 LAB — SALICYLATE LEVEL: Salicylate Lvl: <7 mg/dL — ABNORMAL LOW (ref 7.0–30.0)

## 2021-10-27 LAB — TYPE AND SCREEN
ABO/RH(D): O POS
Antibody Screen: NEGATIVE

## 2021-10-27 LAB — CK: Total CK: 273 U/L (ref 49–397)

## 2021-10-27 LAB — AMMONIA: Ammonia: 25 umol/L (ref 9–35)

## 2021-10-27 LAB — ETHANOL: Alcohol, Ethyl (B): <10 mg/dL (ref <10)

## 2021-10-27 LAB — CBG MONITORING, ED: Glucose-Capillary: 118 mg/dL — ABNORMAL HIGH (ref 70–99)

## 2021-10-27 MED ORDER — MORPHINE SULFATE (PF) 4 MG/ML IV SOLN
4.0000 mg | Freq: Once | INTRAVENOUS | Status: AC
  Administered 2021-10-27: 4 mg via INTRAVENOUS
  Filled 2021-10-27: qty 1

## 2021-10-27 MED ORDER — SODIUM CHLORIDE 0.9 % IV SOLN: INTRAVENOUS | Status: DC

## 2021-10-27 MED ORDER — SODIUM CHLORIDE 0.9 % IV BOLUS
1000.0000 mL | Freq: Once | INTRAVENOUS | Status: AC
  Administered 2021-10-27: 1000 mL via INTRAVENOUS

## 2021-10-27 MED ORDER — ACETAMINOPHEN 325 MG PO TABS: 650.0000 mg | ORAL_TABLET | Freq: Four times a day (QID) | ORAL | Status: DC | PRN

## 2021-10-27 MED ORDER — SODIUM CHLORIDE 0.9% FLUSH
3.0000 mL | Freq: Two times a day (BID) | INTRAVENOUS | Status: DC
  Administered 2021-10-28 – 2021-10-29 (×4): 3 mL via INTRAVENOUS

## 2021-10-27 MED ORDER — SODIUM CHLORIDE 0.9% IV SOLUTION: Freq: Once | INTRAVENOUS | Status: DC

## 2021-10-27 MED ORDER — LORAZEPAM 2 MG/ML IJ SOLN
0.5000 mg | Freq: Four times a day (QID) | INTRAMUSCULAR | Status: DC | PRN
  Administered 2021-10-27: 0.5 mg via INTRAVENOUS
  Filled 2021-10-27: qty 1

## 2021-10-27 MED ORDER — POLYETHYLENE GLYCOL 3350 17 G PO PACK: 17.0000 g | PACK | Freq: Every day | ORAL | Status: DC | PRN

## 2021-10-27 MED ORDER — SODIUM CHLORIDE 0.9 % IV SOLN
10.0000 mL/h | Freq: Once | INTRAVENOUS | Status: AC
  Administered 2021-10-28: 10 mL/h via INTRAVENOUS

## 2021-10-27 MED ORDER — ACETAMINOPHEN 650 MG RE SUPP: 650.0000 mg | Freq: Four times a day (QID) | RECTAL | Status: DC | PRN

## 2021-10-27 MED ORDER — LORAZEPAM 2 MG/ML IJ SOLN
1.0000 mg | Freq: Once | INTRAMUSCULAR | Status: AC
Start: 2021-10-27 — End: 2021-10-27
  Administered 2021-10-27: 1 mg via INTRAVENOUS
  Filled 2021-10-27: qty 1

## 2021-10-27 MED ORDER — SODIUM CHLORIDE 0.9 % IV BOLUS: 1000.0000 mL | Freq: Once | INTRAVENOUS | Status: DC

## 2021-10-27 MED ORDER — ALBUTEROL SULFATE (2.5 MG/3ML) 0.083% IN NEBU: 2.5000 mg | INHALATION_SOLUTION | RESPIRATORY_TRACT | Status: DC | PRN

## 2021-10-27 MED ORDER — MORPHINE SULFATE (PF) 2 MG/ML IV SOLN
1.0000 mg | INTRAVENOUS | Status: DC | PRN
  Filled 2021-10-27: qty 1

## 2021-10-27 MED ORDER — IOHEXOL 300 MG/ML  SOLN
65.0000 mL | Freq: Once | INTRAMUSCULAR | Status: AC | PRN
  Administered 2021-10-27: 65 mL via INTRAVENOUS

## 2021-10-27 NOTE — ED Notes (Signed)
lab called to report platelet count of 22. EDP notified

## 2021-10-27 NOTE — ED Notes (Signed)
Patient transported to CT 

## 2021-10-27 NOTE — H&P (Signed)
History and Physical   Brian Cameron GGY:[id number] DOB: 01-07-1946 DOA: 10/27/2021  PCP: Pcp, No  Patient coming from: Home  Chief Complaint: Altered mental status  HPI: Brian Cameron is a 76 y.o. male with medical history significant of metastatic cancer, anemia, thrombocytopenia presenting with altered mental status and shortness of breath.  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  At that time patient declined Foley catheter, rectal exam, biopsy evaluation.  He was discharged home with hospice/palliative care which he has been receiving per sister.  He is DNR and primarily comfort care and focus on symptom management.  She would like for him to receive evaluation and care if encephalopathy is reversible.  We did discuss that this may be progression of his disease and this may not improve and he may continue to decline more precipitously.  Patient does live alone but sister visits him daily.  Notes that he seemed weaker yesterday and today he was confused and found lying on the floor.  Has been combative at times in the ED.  Possible nonspecific weakness.  Unable be accurately obtained due to patient's drowsiness and encephalopathy.  ED Course: Vital signs in the ED significant for heart rate in the 90s to 120s, blood pressure in the 035K to 093G systolic.  Lab work-up showed CMP with chloride 117, bicarb 16, BUN 30, creatinine 1.84 from previous of 1.4.  Calcium 8.1, protein 5.2, albumin 2.5, ALP 422.  CBC hemoglobin stable at 7.4, platelets 22.  Additional lab work included ammonia which was normal, CK was normal, ethanol level which was normal, aspirin level which was normal, Tylenol which was normal.  Review of Systems: Unable be accurately obtained due to patient's drowsiness and encephalopathy.  No past medical history on file.  No past surgical history on file.  Social  History  reports that he has been smoking cigarettes. He has been smoking an average of 1 pack per day. He has never used smokeless tobacco. He reports current alcohol use. He reports that he does not use drugs.  No Known Allergies  Family History  Problem Relation Age of Onset   Lung cancer Mother    Congestive Heart Failure Father   Reviewed on admission  Prior to Admission medications   Medication Sig Start Date End Date Taking? Authorizing Provider  acetaminophen (TYLENOL) 500 MG tablet Take 1,000 mg by mouth every 6 (six) hours as needed for mild pain.    [provider]  albuterol (PROVENTIL) (2.5 MG/3ML) 0.083% nebulizer solution Take 3 mLs (2.5 mg total) by nebulization every 4 (four) hours as needed for wheezing. 09/24/21   Debbe Odea, MD  Aspirin-Caffeine (BC FAST PAIN RELIEF) 845-65 MG PACK Take 1 Package by mouth daily as needed (pain).    [provider]  ondansetron (ZOFRAN) 4 MG tablet Take 1 tablet (4 mg total) by mouth every 6 (six) hours as needed for nausea. 09/24/21   Debbe Odea, MD    Physical Exam: Vitals:   10/27/21 1815 10/27/21 1830 10/27/21 1845 10/27/21 1900  BP:  130/74 129/68 118/69  Pulse: (!) 126 (!) 124 (!) 125 (!) 128  Resp: 20 (!) $Remo'21 16 15  'FrbJj$ Temp:      TempSrc:      SpO2: 98% 97% 98% 99%   Physical Exam Constitutional:      General: He is not in acute distress.    Appearance: He  is ill-appearing.     Comments: Cachectic  HENT:     Head: Normocephalic and atraumatic.     Mouth/Throat:     Mouth: Mucous membranes are moist.     Pharynx: Oropharynx is clear.  Eyes:     Extraocular Movements: Extraocular movements intact.     Pupils: Pupils are equal, round, and reactive to light.  Cardiovascular:     Rate and Rhythm: Normal rate and regular rhythm.     Pulses: Normal pulses.     Heart sounds: Normal heart sounds.  Pulmonary:     Effort: Pulmonary effort is normal. No respiratory distress.     Breath sounds: Normal  breath sounds.  Abdominal:     General: Bowel sounds are normal. There is no distension.     Palpations: Abdomen is soft.     Tenderness: There is no abdominal tenderness.  Musculoskeletal:        General: No swelling or deformity.  Skin:    General: Skin is warm and dry.     Coloration: Skin is pale.  Neurological:     Comments: Lethargic and drowsy Alert to voice but confused Reporting some pain and needing to urinate   Labs on Admission: I have personally reviewed following labs and imaging studies  CBC: Recent Labs  Lab 10/27/21 1542 10/27/21 1628  WBC 6.6  --   NEUTROABS 4.6  --   HGB 7.5* 7.5*  HCT 25.6* 22.0*  MCV 106.7*  --   PLT 22*  --     Basic Metabolic Panel: Recent Labs  Lab 10/27/21 1542 10/27/21 1628  NA 144 143  K 4.6 4.6  CL 117* 117*  CO2 16*  --   GLUCOSE 110* 105*  BUN 30* 29*  CREATININE 1.84* 1.80*  CALCIUM 8.1*  --     GFR: CrCl cannot be calculated (Unknown ideal weight.).  Liver Function Tests: Recent Labs  Lab 10/27/21 1542  AST 37  ALT 12  ALKPHOS 422*  BILITOT 0.4  PROT 5.2*  ALBUMIN 2.5*    Urine analysis: No results found for: COLORURINE, APPEARANCEUR, LABSPEC, PHURINE, GLUCOSEU, HGBUR, BILIRUBINUR, KETONESUR, PROTEINUR, UROBILINOGEN, NITRITE, LEUKOCYTESUR  Radiological Exams on Admission: CT HEAD WO CONTRAST (5MM)  Result Date: 10/27/2021 CLINICAL DATA:  Mental status change, unknown cause EXAM: CT HEAD WITHOUT CONTRAST TECHNIQUE: Contiguous axial images were obtained from the base of the skull through the vertex without intravenous contrast. RADIATION DOSE REDUCTION: This exam was performed according to the departmental dose-optimization program which includes automated exposure control, adjustment of the mA and/or kV according to patient size and/or use of iterative reconstruction technique. COMPARISON:  CT head 01/14/2013 BRAIN: BRAIN Cerebral ventricle sizes are concordant with the degree of cerebral volume loss.  Patchy and confluent areas of decreased attenuation are noted throughout the deep and periventricular white matter of the cerebral hemispheres bilaterally, compatible with chronic microvascular ischemic disease. No evidence of large-territorial acute infarction. No parenchymal hemorrhage. No mass lesion. No extra-axial collection. No mass effect or midline shift. No hydrocephalus. Basilar cisterns are patent. Vascular: No hyperdense vessel. Atherosclerotic calcifications are present within the cavernous internal carotid arteries. Skull: No acute fracture or focal lesion. Sinuses/Orbits: Paranasal sinuses and mastoid air cells are clear. Left lens replacement. Otherwise the orbits are unremarkable. Other: None. IMPRESSION: No acute intracranial abnormality in a patient with cerebral and cerebellar atrophy as well as chronic microvascular ischemic changes. Electronically Signed   By: Iven Finn M.D.   On: 10/27/2021 17:57  DG Pelvis Portable  Result Date: 10/27/2021 CLINICAL DATA:  Trauma, fall EXAM: PORTABLE PELVIS 1-2 VIEWS COMPARISON:  None. FINDINGS: There are numerous sclerotic lesions in the visualized bony structures. No recent fracture or dislocation is seen. Degenerative changes are noted in the right hip. IMPRESSION: Extensive sclerotic metastatic disease in the bony structures. No recent displaced fracture or dislocation is seen. Degenerative changes are noted in the right hip. Electronically Signed   By: Elmer Picker M.D.   On: 10/27/2021 16:55   CT CHEST ABDOMEN PELVIS W CONTRAST  Result Date: 10/27/2021 CLINICAL DATA:  Prostate cancer, assess treatment response EXAM: CT CHEST, ABDOMEN, AND PELVIS WITH CONTRAST TECHNIQUE: Multidetector CT imaging of the chest, abdomen and pelvis was performed following the standard protocol during bolus administration of intravenous contrast. RADIATION DOSE REDUCTION: This exam was performed according to the departmental dose-optimization program  which includes automated exposure control, adjustment of the mA and/or kV according to patient size and/or use of iterative reconstruction technique. CONTRAST:  53mL OMNIPAQUE IOHEXOL 300 MG/ML  SOLN COMPARISON:  CT abdomen and pelvis dated September 21, 2021 FINDINGS: CT CHEST FINDINGS Cardiovascular: Normal heart size. No pericardial effusion. No suspicious filling defects of the central pulmonary arteries. Atherosclerotic disease of the thoracic aorta and coronary arteries. Mediastinum/Nodes: No pathologically enlarged lymph nodes seen in the chest. Thyroid and esophagus are unremarkable. Lungs/Pleura: Large right and small left pleural effusions which are slightly increased in size when compared with prior exam. Central airways are patent. Bibasilar atelectasis. Stable solid pulmonary nodule of the right middle lobe measuring 8 mm on series 4, image 53. Musculoskeletal: Diffuse osseous sclerosis, unchanged compared to prior exam. Nondisplaced fracture of the right posterolateral eleventh rib, unchanged compared to prior. CT ABDOMEN PELVIS FINDINGS Hepatobiliary: No focal liver abnormality is seen. No gallstones, gallbladder wall thickening, or biliary dilatation. Pancreas: Unremarkable. No pancreatic ductal dilatation or surrounding inflammatory changes. Spleen: Normal in size without focal abnormality. Adrenals/Urinary Tract: Bilateral adrenal glands are unremarkable. Unchanged moderate bilateral hydronephrosis and hydroureter. Nonobstructing stones of the bilateral kidneys. Thick-walled and trabeculated urinary bladder. Stomach/Bowel: Stomach is within normal limits. No evidence of bowel wall thickening, distention, or inflammatory changes. Vascular/Lymphatic: Aortic atherosclerosis. No enlarged abdominal or pelvic lymph nodes. Reproductive: Prostatomegaly, measuring up to 5.0 cm. Other: Diffuse anasarca. Musculoskeletal: Diffuse osseous sclerosis. IMPRESSION: 1. Large right and small left pleural effusions,  increased in size when compared to prior exam. 2. Stable right middle lobe solid pulmonary nodule. 3. Diffuse sclerotic osseous metastatic disease, unchanged compared to prior exam. 4. Unchanged moderate bilateral hydronephrosis and hydroureter with associated thick-walled urinary bladder and prostatomegaly, findings are likely due to chronic outlet obstruction. Electronically Signed   By: Yetta Glassman M.D.   On: 10/27/2021 18:42   DG Chest Port 1 View  Result Date: 10/27/2021 CLINICAL DATA:  Altered mental status EXAM: PORTABLE CHEST 1 VIEW COMPARISON:  Previous studies including the examination of 09/23/2021 FINDINGS: Transverse diameter of heart is increased. Small to moderate bilateral pleural effusions are seen, more so on the right side with interval increase. Central pulmonary vessels are more prominent. Interstitial and alveolar markings are seen in the parahilar regions and lower lung fields, more so on the right side. There is no pneumothorax. Sclerotic lesions are seen in bony structures suggesting skeletal metastatic disease. IMPRESSION: Cardiomegaly. There is interval increase in pulmonary vascular congestion suggesting CHF. Increased interstitial markings are seen in the left parahilar region and left lower lung fields. Alveolar densities seen in the right parahilar region  and right lower lung fields. Findings suggest pulmonary edema, more so on the right side. Possibility of underlying pneumonia is not excluded. Small to moderate bilateral pleural effusions with interval worsening. Electronically Signed   By: Elmer Picker M.D.   On: 10/27/2021 17:03    EKG: Independently reviewed.  Sinus rhythm at 89 bpm.  Some baseline wander.  Assessment/Plan Principal Problem:   Acute encephalopathy Active Problems:   Bilateral pleural effusion   Macrocytic anemia   Thrombocytopenia (HCC)   Metastatic cancer (HCC)   AKI (acute kidney injury) ([place])   Bilateral hydronephrosis   Acute  encephalopathy > Patient noted to be confused today per sister was weak yesterday.  In the setting of metastatic cancer as below. > Unclear etiology at this time.  No infectious etiology suspected at this time as there is no white count and no other evidence of infection on imaging.  Urinalysis is pending.  Could be a degree of dehydration though he has known bladder outlet obstruction he is very cachectic and was found down. > Possibility of brain that could still exist however CT head was negative for any acute or large changes.  Patient previously sent home on hospice so may not benefit from MRI to further evaluate this time. > He is DNR and primarily comfort care and focus on symptom management.  She would like for him to receive evaluation and care if encephalopathy is reversible.  We did discuss that this may be progression of his disease and this may not improve and he may continue to decline more precipitously. 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 > Patient was sent home with hospice and has been seeing hospice per sister,He is DNR and primarily comfort care and focus on symptom management.  She would like for him to receive evaluation and care if encephalopathy is reversible.  We did discuss that this may be progression of his disease and this may not improve and he may continue to decline more precipitously. 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 > Also with anemia and thrombocytopenia believed to be secondary to bony infiltration. > Platelets down to 22, hemoglobin stable at 7.5. > Platelet transfusion ordered in the ED, in effort to get platelets to level where thoracentesis can be performed for therapeutic purposes > Discussed with patient's sister - Monitor on telemetry as above - Consult to palliative care here - Continue with platelet transfusion and therapeutic thoracentesis will need to be ordered tomorrow - As needed bladder scan, as needed in and out caths versus Foley if catheterization is challenging versus holding off on catheterization if he refuses/is too agitated to tolerate - Continue to trend CBC - As needed morphine for pain - As needed Ativan for agitation  AKI > Creatinine elevated to 1.84 from previous of 1.4 in the setting of bladder outlet obstruction from known enlarged prostate and concern for metastatic prostate cancer as above. - As needed bladder scan and as needed in and out cath -  If he does not tolerate catheter for agitation regions we will hold off on catheter for now as it is not in agreement with his wishes as a hospice patient  DVT prophylaxis: Lovenox Code Status:   DNR Family Communication:  Sister updated at bedside Disposition Plan:   Patient is from:  Home  Anticipated DC to:  Pending clinical course  Anticipated DC date:  1 to 4 days  Anticipated DC barriers: None  Consults called:  Message into palliative care Admission status:  Observation, telemetry  Severity of Illness: The appropriate patient status for this patient is OBSERVATION. Observation status is judged to be reasonable and necessary in order to provide the required intensity of service to ensure the patient's safety. The patient's presenting symptoms, physical exam findings, and initial radiographic and laboratory data in the context of their medical condition is felt to place them at decreased  risk for further clinical deterioration. Furthermore, it is anticipated that the patient will be medically stable for discharge from the hospital within 2 midnights of admission.    Marcelyn Bruins MD [facility]  How to contact the [facility] Attending or Consulting provider [place] or covering provider during after hours [employer], for this patient?   Check the care team in [facility] and look for a) attending/consulting [facility] provider listed and b) the [facility] team listed Log into [email or web address] and use [place]'s universal password to access. If you do not have the password, please contact the hospital operator. Locate the [facility] provider you are looking for under [facility] and page to a number that you can be directly reached. If you still have difficulty reaching the provider, please page the [facility] (Director on Call) for the Hospitalists listed on amion for assistance.  10/27/2021, 8:22 PM

## 2021-10-27 NOTE — ED Triage Notes (Addendum)
Pt BIB [facility] from home b/c sister found him on floor with left sided weakness.  It is unclear how long he was on the floor or when his LKW was.  Pt is being followed by hospice for adv prostate cancer.  EMS states only trazadone and oxycodone found at house.   Pt was uncooperative for EMS and has been so on arrival as well.  When asked his name he gave me a diff name but correct Dob. When asked why he gave a diff name he stated "to be obstinate".  EMS vitals:150/60 afib HR 80, 97% CBG 150

## 2021-10-27 NOTE — ED Provider Notes (Addendum)
[facility] EMERGENCY DEPARTMENT Provider Note   CSN: [id number] Arrival date & time: 10/27/21  1529     History  No chief complaint on file.   Brian Cameron is a 76 y.o. male history of alcohol and tobacco abuse, metastatic prostate cancer here presenting with shortness of breath and altered mental status.  Patient lives at home by himself.  His daughter comes and then checking on him every day.  She states that he seemed to be weaker yesterday.  Today when she checked on him, he was laying on the floor and very confused and altered.  On arrival, patient is cursing and yelling and states that he is in pain everywhere for  The history is provided by the patient and a relative.      Home Medications Prior to Admission medications   Medication Sig Start Date End Date Taking? Authorizing Provider  acetaminophen (TYLENOL) 500 MG tablet Take 1,000 mg by mouth every 6 (six) hours as needed for mild pain.    [provider]  albuterol (PROVENTIL) (2.5 MG/3ML) 0.083% nebulizer solution Take 3 mLs (2.5 mg total) by nebulization every 4 (four) hours as needed for wheezing. 09/24/21   Debbe Odea, MD  Aspirin-Caffeine (BC FAST PAIN RELIEF) 845-65 MG PACK Take 1 Package by mouth daily as needed (pain).    [provider]  ondansetron (ZOFRAN) 4 MG tablet Take 1 tablet (4 mg total) by mouth every 6 (six) hours as needed for nausea. 09/24/21   Debbe Odea, MD      Allergies    Patient has no known allergies.    Review of Systems   Review of Systems  Neurological:  Positive for weakness.  Psychiatric/Behavioral:  Positive for confusion.   All other systems reviewed and are negative.  Physical Exam Updated Vital Signs BP (!) 145/72    Pulse 93    Temp 98 F (36.7 C) (Oral)    Resp 18    SpO2 100%  Physical Exam Vitals and nursing note reviewed.  Constitutional:      Comments: Chronically ill, disheveled  HENT:     Head: Normocephalic.     Nose:  Nose normal.     Mouth/Throat:     Mouth: Mucous membranes are moist.  Eyes:     Extraocular Movements: Extraocular movements intact.     Pupils: Pupils are equal, round, and reactive to light.  Cardiovascular:     Rate and Rhythm: Normal rate and regular rhythm.     Pulses: Normal pulses.     Heart sounds: Normal heart sounds.  Pulmonary:     Comments: Diminished on the right lung base.  Normal breath sounds on the left Abdominal:     General: Abdomen is flat.     Comments: Mild epigastric tenderness  Musculoskeletal:        General: Normal range of motion.     Cervical back: Normal range of motion and neck supple.  Skin:    General: Skin is warm.     Capillary Refill: Capillary refill takes less than 2 seconds.  Neurological:     Comments: Patient is more contracted on the left side.  No obvious seizure-like activity.  Psychiatric:     Comments: Unable    ED Results / Procedures / Treatments   Labs (all labs ordered are listed, but only abnormal results are displayed) Labs Reviewed  CBC WITH DIFFERENTIAL/PLATELET - Abnormal; Notable for the following components:  Result Value   RBC 2.40 (*)    Hemoglobin 7.5 (*)    HCT 25.6 (*)    MCV 106.7 (*)    MCHC 29.3 (*)    RDW 18.8 (*)    Platelets 22 (*)    nRBC 3.2 (*)    nRBC 6 (*)    All other components within normal limits  COMPREHENSIVE METABOLIC PANEL - Abnormal; Notable for the following components:   Chloride 117 (*)    CO2 16 (*)    Glucose, Bld 110 (*)    BUN 30 (*)    Creatinine, Ser 1.84 (*)    Calcium 8.1 (*)    Total Protein 5.2 (*)    Albumin 2.5 (*)    Alkaline Phosphatase 422 (*)    GFR, Estimated 37 (*)    All other components within normal limits  SALICYLATE LEVEL - Abnormal; Notable for the following components:   Salicylate Lvl <6.8 (*)    All other components within normal limits  ACETAMINOPHEN LEVEL - Abnormal; Notable for the following components:   Acetaminophen (Tylenol), Serum <10  (*)    All other components within normal limits  CBG MONITORING, ED - Abnormal; Notable for the following components:   Glucose-Capillary 118 (*)    All other components within normal limits  I-STAT CHEM 8, ED - Abnormal; Notable for the following components:   Chloride 117 (*)    BUN 29 (*)    Creatinine, Ser 1.80 (*)    Glucose, Bld 105 (*)    Calcium, Ion 1.10 (*)    TCO2 21 (*)    Hemoglobin 7.5 (*)    HCT 22.0 (*)    All other components within normal limits  RESP PANEL BY RT-PCR (FLU A&B, COVID) ARPGX2  ETHANOL  AMMONIA  CK  PROTIME-INR  URINALYSIS, ROUTINE W REFLEX MICROSCOPIC  RAPID URINE DRUG SCREEN, HOSP PERFORMED  TYPE AND SCREEN  PREPARE PLATELET PHERESIS  PREPARE PLATELET PHERESIS    EKG EKG Interpretation  Date/Time:  Tuesday October 27 2021 15:30:09 EST Ventricular Rate:  89 PR Interval:  184 QRS Duration: 83 QT Interval:  394 QTC Calculation: 480 R Axis:   93 Text Interpretation: Sinus rhythm Anterior infarct, old No significant change since last tracing Confirmed by Wandra Arthurs [phone]) on 10/27/2021 4:26:02 PM  Radiology CT HEAD WO CONTRAST (5MM)  Result Date: 10/27/2021 CLINICAL DATA:  Mental status change, unknown cause EXAM: CT HEAD WITHOUT CONTRAST TECHNIQUE: Contiguous axial images were obtained from the base of the skull through the vertex without intravenous contrast. RADIATION DOSE REDUCTION: This exam was performed according to the departmental dose-optimization program which includes automated exposure control, adjustment of the mA and/or kV according to patient size and/or use of iterative reconstruction technique. COMPARISON:  CT head 01/14/2013 BRAIN: BRAIN Cerebral ventricle sizes are concordant with the degree of cerebral volume loss. Patchy and confluent areas of decreased attenuation are noted throughout the deep and periventricular white matter of the cerebral hemispheres bilaterally, compatible with chronic microvascular ischemic disease.  No evidence of large-territorial acute infarction. No parenchymal hemorrhage. No mass lesion. No extra-axial collection. No mass effect or midline shift. No hydrocephalus. Basilar cisterns are patent. Vascular: No hyperdense vessel. Atherosclerotic calcifications are present within the cavernous internal carotid arteries. Skull: No acute fracture or focal lesion. Sinuses/Orbits: Paranasal sinuses and mastoid air cells are clear. Left lens replacement. Otherwise the orbits are unremarkable. Other: None. IMPRESSION: No acute intracranial abnormality in a patient with cerebral and  cerebellar atrophy as well as chronic microvascular ischemic changes. Electronically Signed   By: Iven Finn M.D.   On: 10/27/2021 17:57   DG Pelvis Portable  Result Date: 10/27/2021 CLINICAL DATA:  Trauma, fall EXAM: PORTABLE PELVIS 1-2 VIEWS COMPARISON:  None. FINDINGS: There are numerous sclerotic lesions in the visualized bony structures. No recent fracture or dislocation is seen. Degenerative changes are noted in the right hip. IMPRESSION: Extensive sclerotic metastatic disease in the bony structures. No recent displaced fracture or dislocation is seen. Degenerative changes are noted in the right hip. Electronically Signed   By: Elmer Picker M.D.   On: 10/27/2021 16:55   CT CHEST ABDOMEN PELVIS W CONTRAST  Result Date: 10/27/2021 CLINICAL DATA:  Prostate cancer, assess treatment response EXAM: CT CHEST, ABDOMEN, AND PELVIS WITH CONTRAST TECHNIQUE: Multidetector CT imaging of the chest, abdomen and pelvis was performed following the standard protocol during bolus administration of intravenous contrast. RADIATION DOSE REDUCTION: This exam was performed according to the departmental dose-optimization program which includes automated exposure control, adjustment of the mA and/or kV according to patient size and/or use of iterative reconstruction technique. CONTRAST:  86mL OMNIPAQUE IOHEXOL 300 MG/ML  SOLN COMPARISON:  CT  abdomen and pelvis dated September 21, 2021 FINDINGS: CT CHEST FINDINGS Cardiovascular: Normal heart size. No pericardial effusion. No suspicious filling defects of the central pulmonary arteries. Atherosclerotic disease of the thoracic aorta and coronary arteries. Mediastinum/Nodes: No pathologically enlarged lymph nodes seen in the chest. Thyroid and esophagus are unremarkable. Lungs/Pleura: Large right and small left pleural effusions which are slightly increased in size when compared with prior exam. Central airways are patent. Bibasilar atelectasis. Stable solid pulmonary nodule of the right middle lobe measuring 8 mm on series 4, image 53. Musculoskeletal: Diffuse osseous sclerosis, unchanged compared to prior exam. Nondisplaced fracture of the right posterolateral eleventh rib, unchanged compared to prior. CT ABDOMEN PELVIS FINDINGS Hepatobiliary: No focal liver abnormality is seen. No gallstones, gallbladder wall thickening, or biliary dilatation. Pancreas: Unremarkable. No pancreatic ductal dilatation or surrounding inflammatory changes. Spleen: Normal in size without focal abnormality. Adrenals/Urinary Tract: Bilateral adrenal glands are unremarkable. Unchanged moderate bilateral hydronephrosis and hydroureter. Nonobstructing stones of the bilateral kidneys. Thick-walled and trabeculated urinary bladder. Stomach/Bowel: Stomach is within normal limits. No evidence of bowel wall thickening, distention, or inflammatory changes. Vascular/Lymphatic: Aortic atherosclerosis. No enlarged abdominal or pelvic lymph nodes. Reproductive: Prostatomegaly, measuring up to 5.0 cm. Other: Diffuse anasarca. Musculoskeletal: Diffuse osseous sclerosis. IMPRESSION: 1. Large right and small left pleural effusions, increased in size when compared to prior exam. 2. Stable right middle lobe solid pulmonary nodule. 3. Diffuse sclerotic osseous metastatic disease, unchanged compared to prior exam. 4. Unchanged moderate bilateral  hydronephrosis and hydroureter with associated thick-walled urinary bladder and prostatomegaly, findings are likely due to chronic outlet obstruction. Electronically Signed   By: Yetta Glassman M.D.   On: 10/27/2021 18:42   DG Chest Port 1 View  Result Date: 10/27/2021 CLINICAL DATA:  Altered mental status EXAM: PORTABLE CHEST 1 VIEW COMPARISON:  Previous studies including the examination of 09/23/2021 FINDINGS: Transverse diameter of heart is increased. Small to moderate bilateral pleural effusions are seen, more so on the right side with interval increase. Central pulmonary vessels are more prominent. Interstitial and alveolar markings are seen in the parahilar regions and lower lung fields, more so on the right side. There is no pneumothorax. Sclerotic lesions are seen in bony structures suggesting skeletal metastatic disease. IMPRESSION: Cardiomegaly. There is interval increase in pulmonary vascular congestion  suggesting CHF. Increased interstitial markings are seen in the left parahilar region and left lower lung fields. Alveolar densities seen in the right parahilar region and right lower lung fields. Findings suggest pulmonary edema, more so on the right side. Possibility of underlying pneumonia is not excluded. Small to moderate bilateral pleural effusions with interval worsening. Electronically Signed   By: Elmer Picker M.D.   On: 10/27/2021 17:03    Procedures BLADDER CATHETERIZATION  Date/Time: 10/27/2021 8:48 PM Performed by: Drenda Freeze, MD Authorized by: Drenda Freeze, MD   Consent:    Consent obtained:  Verbal   Consent given by:  Patient   Risks, benefits, and alternatives were discussed: yes     Risks discussed:  False passage   Alternatives discussed:  No treatment Universal protocol:    Patient identity confirmed:  Verbally with patient Pre-procedure details:    Procedure purpose:  Therapeutic Anesthesia:    Anesthesia method:  None Procedure details:     Provider performed due to:  Altered anatomy   Altered anatomy:  Urethral stricture   Catheter insertion:  Indwelling   Catheter type:  Coude   Catheter size:  20 Fr   Bladder irrigation: no     Number of attempts:  2   Urine characteristics:  Dark Post-procedure details:    Procedure completion:  Tolerated    CRITICAL CARE Performed by: Wandra Arthurs   Total critical care time: 30 minutes  Critical care time was exclusive of separately billable procedures and treating other patients.  Critical care was necessary to treat or prevent imminent or life-threatening deterioration.  Critical care was time spent personally by me on the following activities: development of treatment plan with patient and/or surrogate as well as nursing, discussions with consultants, evaluation of patient's response to treatment, examination of patient, obtaining history from patient or surrogate, ordering and performing treatments and interventions, ordering and review of laboratory studies, ordering and review of radiographic studies, pulse oximetry and re-evaluation of patient's condition.     Medications Ordered in ED Medications  0.9 %  sodium chloride infusion (has no administration in time range)  0.9 %  sodium chloride infusion (Manually program via Guardrails IV Fluids) (has no administration in time range)  sodium chloride 0.9 % bolus 1,000 mL (0 mLs Intravenous Stopped 10/27/21 1720)  morphine 4 MG/ML injection 4 mg (4 mg Intravenous Given 10/27/21 1619)  LORazepam (ATIVAN) injection 1 mg (1 mg Intravenous Given 10/27/21 1619)  iohexol (OMNIPAQUE) 300 MG/ML solution 65 mL (65 mLs Intravenous Contrast Given 10/27/21 1748)    ED Course/ Medical Decision Making/ A&P                           Medical Decision Making MOOSA BUECHE is a 76 y.o. male here presenting with left-sided weakness.  She was recently admitted for large pleural effusion likely secondary to metastatic prostate cancer.   Patient is on pain medicine at home.  Consider drug overdose versus intracranial bleeding versus worsening pleural effusion from metastatic prostate cancer.  Plan to get CT head and CT chest abdomen pelvis.  Plan to also get CBC and CMP  6:56 PM Patient's platelet count is 22 which is likely secondary to his cancer.  Patient's alk phos is also elevated and creatinine slightly elevated than baseline.  CT again showed large right pleural effusion.  Given his thrombocytopenia, I do not feel that he needs emergent thoracentesis.  He  would benefit from platelet transfusion and when his platelet is greater than 50, then he can get thoracentesis.  At this point, hospitalist to admit for thrombocytopenia, right pleural effusion.   8:49 PM Patient was noted to have a bladder size of over 400.  Patient unable to urinate.  Nurse tried to place a Foley catheter but was unable to.  I was able to place a 20 [place] coud catheter.  Around 400 cc came out.  Problems Addressed: Pleural effusion: chronic illness or injury with exacerbation, progression, or side effects of treatment Thrombocytopenia ([place]): acute illness or injury  Amount and/or Complexity of Data Reviewed Independent Historian: caregiver External Data Reviewed: labs, radiology and notes. Labs: ordered. Decision-making details documented in ED Course. Radiology: ordered. ECG/medicine tests: ordered and independent interpretation performed. Decision-making details documented in ED Course.  Risk Prescription drug management. Decision regarding hospitalization.  Final Clinical Impression(s) / ED Diagnoses Final diagnoses:  None    Rx / DC Orders ED Discharge Orders     None         Drenda Freeze, MD 10/27/21 Mauro Kaufmann    Drenda Freeze, MD 10/27/21 2049

## 2021-10-28 ENCOUNTER — Observation Stay (HOSPITAL_COMMUNITY)

## 2021-10-28 ENCOUNTER — Other Ambulatory Visit: Payer: Self-pay

## 2021-10-28 ENCOUNTER — Encounter (HOSPITAL_COMMUNITY): Payer: Self-pay | Admitting: Internal Medicine

## 2021-10-28 ENCOUNTER — Inpatient Hospital Stay (HOSPITAL_COMMUNITY)

## 2021-10-28 DIAGNOSIS — E43 Unspecified severe protein-calorie malnutrition: Secondary | ICD-10-CM | POA: Diagnosis present

## 2021-10-28 DIAGNOSIS — G9340 Encephalopathy, unspecified: Secondary | ICD-10-CM | POA: Diagnosis not present

## 2021-10-28 DIAGNOSIS — Z66 Do not resuscitate: Secondary | ICD-10-CM | POA: Diagnosis present

## 2021-10-28 DIAGNOSIS — R0600 Dyspnea, unspecified: Secondary | ICD-10-CM

## 2021-10-28 DIAGNOSIS — C7951 Secondary malignant neoplasm of bone: Secondary | ICD-10-CM | POA: Diagnosis present

## 2021-10-28 DIAGNOSIS — Z681 Body mass index (BMI) 19 or less, adult: Secondary | ICD-10-CM | POA: Diagnosis not present

## 2021-10-28 DIAGNOSIS — G936 Cerebral edema: Secondary | ICD-10-CM | POA: Diagnosis present

## 2021-10-28 DIAGNOSIS — Z515 Encounter for palliative care: Secondary | ICD-10-CM | POA: Diagnosis not present

## 2021-10-28 DIAGNOSIS — I63531 Cerebral infarction due to unspecified occlusion or stenosis of right posterior cerebral artery: Secondary | ICD-10-CM | POA: Diagnosis present

## 2021-10-28 DIAGNOSIS — N320 Bladder-neck obstruction: Secondary | ICD-10-CM | POA: Diagnosis present

## 2021-10-28 DIAGNOSIS — N35919 Unspecified urethral stricture, male, unspecified site: Secondary | ICD-10-CM | POA: Diagnosis present

## 2021-10-28 DIAGNOSIS — Z801 Family history of malignant neoplasm of trachea, bronchus and lung: Secondary | ICD-10-CM | POA: Diagnosis not present

## 2021-10-28 DIAGNOSIS — R29700 NIHSS score 0: Secondary | ICD-10-CM | POA: Diagnosis present

## 2021-10-28 DIAGNOSIS — C61 Malignant neoplasm of prostate: Secondary | ICD-10-CM | POA: Diagnosis present

## 2021-10-28 DIAGNOSIS — R64 Cachexia: Secondary | ICD-10-CM | POA: Diagnosis present

## 2021-10-28 DIAGNOSIS — F17210 Nicotine dependence, cigarettes, uncomplicated: Secondary | ICD-10-CM | POA: Diagnosis present

## 2021-10-28 DIAGNOSIS — N179 Acute kidney failure, unspecified: Secondary | ICD-10-CM | POA: Diagnosis present

## 2021-10-28 DIAGNOSIS — G9341 Metabolic encephalopathy: Secondary | ICD-10-CM | POA: Diagnosis present

## 2021-10-28 DIAGNOSIS — N1831 Chronic kidney disease, stage 3a: Secondary | ICD-10-CM | POA: Diagnosis present

## 2021-10-28 DIAGNOSIS — Z8249 Family history of ischemic heart disease and other diseases of the circulatory system: Secondary | ICD-10-CM | POA: Diagnosis not present

## 2021-10-28 DIAGNOSIS — Z20822 Contact with and (suspected) exposure to covid-19: Secondary | ICD-10-CM | POA: Diagnosis present

## 2021-10-28 DIAGNOSIS — R748 Abnormal levels of other serum enzymes: Secondary | ICD-10-CM | POA: Diagnosis present

## 2021-10-28 DIAGNOSIS — L8990 Pressure ulcer of unspecified site, unspecified stage: Secondary | ICD-10-CM | POA: Insufficient documentation

## 2021-10-28 DIAGNOSIS — D630 Anemia in neoplastic disease: Secondary | ICD-10-CM | POA: Diagnosis present

## 2021-10-28 DIAGNOSIS — J90 Pleural effusion, not elsewhere classified: Secondary | ICD-10-CM

## 2021-10-28 DIAGNOSIS — N1330 Unspecified hydronephrosis: Secondary | ICD-10-CM

## 2021-10-28 DIAGNOSIS — E872 Acidosis, unspecified: Secondary | ICD-10-CM | POA: Diagnosis present

## 2021-10-28 DIAGNOSIS — I5033 Acute on chronic diastolic (congestive) heart failure: Secondary | ICD-10-CM | POA: Diagnosis present

## 2021-10-28 DIAGNOSIS — D6959 Other secondary thrombocytopenia: Secondary | ICD-10-CM | POA: Diagnosis present

## 2021-10-28 LAB — COMPREHENSIVE METABOLIC PANEL
ALT: 11 U/L (ref 0–44)
AST: 43 U/L — ABNORMAL HIGH (ref 15–41)
Albumin: 2.4 g/dL — ABNORMAL LOW (ref 3.5–5.0)
Alkaline Phosphatase: 402 U/L — ABNORMAL HIGH (ref 38–126)
Anion gap: 8 (ref 5–15)
BUN: 28 mg/dL — ABNORMAL HIGH (ref 8–23)
CO2: 17 mmol/L — ABNORMAL LOW (ref 22–32)
Calcium: 8 mg/dL — ABNORMAL LOW (ref 8.9–10.3)
Chloride: 119 mmol/L — ABNORMAL HIGH (ref 98–111)
Creatinine, Ser: 1.72 mg/dL — ABNORMAL HIGH (ref 0.61–1.24)
GFR, Estimated: 40 mL/min — ABNORMAL LOW (ref >60)
Glucose, Bld: 104 mg/dL — ABNORMAL HIGH (ref 70–99)
Potassium: 4.4 mmol/L (ref 3.5–5.1)
Sodium: 144 mmol/L (ref 135–145)
Total Bilirubin: 0.6 mg/dL (ref 0.3–1.2)
Total Protein: 5.4 g/dL — ABNORMAL LOW (ref 6.5–8.1)

## 2021-10-28 LAB — BPAM PLATELET PHERESIS
Blood Product Expiration Date: 202302022359
ISSUE DATE / TIME: 202301312108
Unit Type and Rh: 6200

## 2021-10-28 LAB — MAGNESIUM: Magnesium: 2.1 mg/dL (ref 1.7–2.4)

## 2021-10-28 LAB — CBC
HCT: 26.1 % — ABNORMAL LOW (ref 39.0–52.0)
Hemoglobin: 7.8 g/dL — ABNORMAL LOW (ref 13.0–17.0)
MCH: 31.3 pg (ref 26.0–34.0)
MCHC: 29.9 g/dL — ABNORMAL LOW (ref 30.0–36.0)
MCV: 104.8 fL — ABNORMAL HIGH (ref 80.0–100.0)
Platelets: 47 10*3/uL — ABNORMAL LOW (ref 150–400)
RBC: 2.49 MIL/uL — ABNORMAL LOW (ref 4.22–5.81)
RDW: 18.9 % — ABNORMAL HIGH (ref 11.5–15.5)
WBC: 7.1 10*3/uL (ref 4.0–10.5)
nRBC: 3.1 % — ABNORMAL HIGH (ref 0.0–0.2)

## 2021-10-28 LAB — BLOOD GAS, VENOUS
Acid-base deficit: 6.4 mmol/L — ABNORMAL HIGH (ref 0.0–2.0)
Bicarbonate: 19.1 mmol/L — ABNORMAL LOW (ref 20.0–28.0)
Drawn by: 1370
FIO2: 28
O2 Saturation: 53.7 %
Patient temperature: 37
pCO2, Ven: 41.7 mmHg — ABNORMAL LOW (ref 44.0–60.0)
pH, Ven: 7.283 (ref 7.250–7.430)
pO2, Ven: 33.3 mmHg (ref 32.0–45.0)

## 2021-10-28 LAB — I-STAT VENOUS BLOOD GAS, ED
Acid-base deficit: 7 mmol/L — ABNORMAL HIGH (ref 0.0–2.0)
Bicarbonate: 19 mmol/L — ABNORMAL LOW (ref 20.0–28.0)
Calcium, Ion: 1.14 mmol/L — ABNORMAL LOW (ref 1.15–1.40)
HCT: 24 % — ABNORMAL LOW (ref 39.0–52.0)
Hemoglobin: 8.2 g/dL — ABNORMAL LOW (ref 13.0–17.0)
O2 Saturation: 81 %
Potassium: 4.4 mmol/L (ref 3.5–5.1)
Sodium: 145 mmol/L (ref 135–145)
TCO2: 20 mmol/L — ABNORMAL LOW (ref 22–32)
pCO2, Ven: 38.9 mmHg — ABNORMAL LOW (ref 44.0–60.0)
pH, Ven: 7.295 (ref 7.250–7.430)
pO2, Ven: 50 mmHg — ABNORMAL HIGH (ref 32.0–45.0)

## 2021-10-28 LAB — PREPARE PLATELET PHERESIS: Unit division: 0

## 2021-10-28 LAB — VITAMIN B12: Vitamin B-12: 685 pg/mL (ref 180–914)

## 2021-10-28 LAB — BRAIN NATRIURETIC PEPTIDE: B Natriuretic Peptide: 926.8 pg/mL — ABNORMAL HIGH (ref 0.0–100.0)

## 2021-10-28 LAB — TSH: TSH: 4.127 u[IU]/mL (ref 0.350–4.500)

## 2021-10-28 MED ORDER — SODIUM CHLORIDE 0.9 % IV SOLN: INTRAVENOUS | Status: DC

## 2021-10-28 MED ORDER — CHLORHEXIDINE GLUCONATE CLOTH 2 % EX PADS
6.0000 | MEDICATED_PAD | Freq: Every day | CUTANEOUS | Status: DC
  Administered 2021-10-28 – 2021-10-29 (×2): 6 via TOPICAL

## 2021-10-28 MED ORDER — FUROSEMIDE 10 MG/ML IJ SOLN
40.0000 mg | Freq: Once | INTRAMUSCULAR | Status: AC
  Administered 2021-10-28: 40 mg via INTRAVENOUS
  Filled 2021-10-28: qty 4

## 2021-10-28 MED ORDER — SODIUM CHLORIDE 0.9 % IV SOLN
1.0000 g | INTRAVENOUS | Status: DC
  Administered 2021-10-28: 1 g via INTRAVENOUS
  Filled 2021-10-28: qty 10

## 2021-10-28 MED ORDER — HYDROMORPHONE HCL 1 MG/ML IJ SOLN
1.0000 mg | INTRAMUSCULAR | Status: AC | PRN
  Administered 2021-10-28 – 2021-10-29 (×4): 1 mg via INTRAVENOUS
  Filled 2021-10-28 (×4): qty 1

## 2021-10-28 MED ORDER — FUROSEMIDE 10 MG/ML IJ SOLN
40.0000 mg | Freq: Once | INTRAMUSCULAR | Status: AC
  Administered 2021-10-28: 40 mg via INTRAVENOUS
  Filled 2021-10-28 (×2): qty 4

## 2021-10-28 MED ORDER — THIAMINE HCL 100 MG/ML IJ SOLN
100.0000 mg | Freq: Every day | INTRAMUSCULAR | Status: DC
  Administered 2021-10-29: 100 mg via INTRAVENOUS
  Filled 2021-10-28: qty 2

## 2021-10-28 MED ORDER — LORAZEPAM 2 MG/ML IJ SOLN
1.0000 mg | Freq: Four times a day (QID) | INTRAMUSCULAR | Status: DC | PRN
  Administered 2021-10-28 (×2): 1 mg via INTRAVENOUS
  Filled 2021-10-28 (×2): qty 1

## 2021-10-28 NOTE — Assessment & Plan Note (Signed)
-  with elevated BNP suggesting a degree of acute on chronic diastolic CHF versus malignant pleural effusions.  Most recent 2D echo done in December 2022 showed normal EF 55-60%, LVH, could not evaluate diastolic dysfunction.

## 2021-10-28 NOTE — Progress Notes (Signed)
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  [facility] was notified by sister after patient was transported from home to the [facility] ED for evaluation after patient was found at home lying on the floor from possible fall.   Patient was admitted on 10/27/21 with Acute Metabolic Encephalopathy. Per Dr. Karie Georges of [facility], this is a related admission. Patient is a DNR.   Checked in with ED RN prior to visit, patient has been admitted and awaiting bed placement at this time. Patient did have issues with oxygenation saturation during night shift needing up to 4L O2 via [place] - currently titrated back down to 2L with sats at 100%. During bedside visit, patient was nonresponsive to verbal stimuli, with shallow breaths in NAD. Spoke to patient POA Brian Cameron to support and update on hospitalization who is aware that [facility] will continue to follow patient daily during this hospital admission. Family has [facility] contact information and encouraged to reach out to as needed for support.    Patient is appropriate for inpatient level of care due to the ongoing assessment of current encephalopathy symptoms, persistent confusion and management of  IV antibiotics.   V/S:  97.4, 87, 17, 146/51, 100% sats on 2L O2 via [place] I&O: 1606/1650 (-44) Abnormal lab work: Hemo 8.2, HCT 24.0, Albumin 2.4,CL 119, CO2 17, Glucose 104, BUN 28, Creat 1.72, Cal 8.0, AST 43, BNP 926.8 Covid negative on 10/27/21 Diagnostics:  CT scan shows unchanged moderate bil hydronephrosis and hydrouteter with thick walled urinary bladder and prostatomegaly, suggesting chronic outlet obstruction. IVs/PRNs: No IVFs at this time, Dilaudid 1mg  IV adm at 1314 on 2/1, Ativan 1 mg IV adm at 0130 on 2/1 Skin: Abrasion to left face noted, Stage 2 coccyx pressure injury   Per EPIC MD Problem list:  Principal problem Acute  metabolic encephalopathy -patient was noted to be confused with increased weakness by his sister.  Unclear etiology at this time, possibly UTI given presence of bacteria but no significant pyuria.  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  Please refer to prior discharge summary in December 2022 but he essentially did not want any further work-up, declined Foley catheter, rectal exam, biopsy and was sent home with hospice.  He is DNR.  Palliative care consulted, if his encephalopathy persists and he has poor p.o. intake may benefit from residential hospice at this point   Bladder outlet obstruction-CT scan on admission shows unchanged moderate bilateral hydronephrosis and hydroureter with thick-walled urinary bladder and prostatomegaly, suggesting chronic outlet obstruction   Thrombocytopenia -possibly in the setting of advanced malignancy  Anemia-possibly in the setting of advanced malignancy  Pleural effusions-with elevated BNP suggesting a degree of acute on chronic diastolic CHF versus malignant pleural effusions.  Most recent 2D echo done in December 2022 showed normal EF 55-60%, LVH, could not evaluate diastolic dysfunction.   Acute kidney injury on chronic kidney disease stage IIIa -Baseline creatinine around 1.3-1.4, 1.84 on admission, improved a little bit to 1.7 this morning.  Monitor after receiving Lasix.  He did not agree to Foley catheter in the past, avoid for now    D/C planning- Ongoing assessment - patient living in home alone on hospice at this time - may have increased needs of care. Discharge date  unknown at this time. Please use [facility] for transportation needs upon discharge of all [facility] patient.  Goals of Care: Clear - Patient is a DNR and family wishes to continue with the support of hospice. Communication with [facility] [place] team updated on patient condition.  Transfer summary and Current Medication List scanned in [facility] ED to EPIC. Communication with [facility] - Supported patient sister/POA on phone.   Please call with any hospice related questions/concerns,   Gar Ponto, RN [place] (in [place]) [phone]

## 2021-10-28 NOTE — Plan of Care (Signed)
Brief Neurology Update note:  Briefly, Mr. Brian Cameron is a 76 y.o. male who is DNR and hospice in the setting of metastatic prostrate cancer, thrombocytopenia. He was admitted for shortness of breath with primary focus on comfort measures but family wanted evaluation for any reversible causes of encephalopathy. Workup with MRI Brain demonstrated a large R PCA stroke with some petechial hemorrhage vs SAH. His more recent platelet count is 47.  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 I suspect that potential hypercoagulability from cancer could be contributing to this. He is high risk for bleeding given thrombocytopenia and petechial hemorrhage vs SAH. I would hold off on antiplatelet for secondary stroke prophylaxis for now.  The hospitalist team asked if I could document this in a note and I am happy to help. They will discuss with family and let us know if they would like [place] to formally see Mr. Weisensel.   [place] Pager Number [phone]

## 2021-10-28 NOTE — ED Notes (Addendum)
Pt began dropping his sats and breathing effort and pattern seemed to change to more labored and shallow.  Pt placed on 4L to achieve sat of 92% or better.  Dr. Tonie Griffith notified and concerns expressed for possible fluid overload based on previous xray and fluid adminstration for this stay.  Stat xray, lasix and vbg being ordered by Dr. Tonie Griffith

## 2021-10-28 NOTE — Assessment & Plan Note (Signed)
-  CT scan on admission shows unchanged moderate bilateral hydronephrosis and hydroureter with thick-walled urinary bladder and prostatomegaly, suggesting chronic outlet obstruction

## 2021-10-28 NOTE — Plan of Care (Signed)
  Problem: Education: Goal: Knowledge of General Education information will improve Description: Including pain rating scale, medication(s)/side effects and non-pharmacologic comfort measures Outcome: Not Progressing   Problem: Health Behavior/Discharge Planning: Goal: Ability to manage health-related needs will improve Outcome: Not Progressing   Problem: Clinical Measurements: Goal: Ability to maintain clinical measurements within normal limits will improve Outcome: Not Progressing Goal: Will remain free from infection Outcome: Not Progressing Goal: Diagnostic test results will improve Outcome: Not Progressing Goal: Respiratory complications will improve Outcome: Not Progressing Goal: Cardiovascular complication will be avoided Outcome: Not Progressing   Problem: Activity: Goal: Risk for activity intolerance will decrease Outcome: Not Progressing   Problem: Nutrition: Goal: Adequate nutrition will be maintained Outcome: Not Progressing   Problem: Coping: Goal: Level of anxiety will decrease Outcome: Not Progressing   Problem: Elimination: Goal: Will not experience complications related to bowel motility Outcome: Not Progressing Goal: Will not experience complications related to urinary retention Outcome: Not Progressing   Problem: Pain Managment: Goal: General experience of comfort will improve Outcome: Not Progressing   

## 2021-10-28 NOTE — Progress Notes (Signed)
Brian Cameron from [facility] called to report that their communication system is down and that she needs to notify the provider that the results are in and that there are finding that should be reported. Attending notified via secure chat.

## 2021-10-28 NOTE — Assessment & Plan Note (Signed)
-  possibly in the setting of advanced malignancy

## 2021-10-28 NOTE — Progress Notes (Signed)
Brief Palliative Medicine Progress Note:  PMT consult received and chart reviewed.   Noted Brian Cameron is a current hospice patient with Manufacturing engineer (Brian Cameron). I spoke with [facility] liaison, Brian Cameron, who is aware the patient is in the ED. An [facility] representative is coming to the hospital to discuss Brian Cameron as they have an established relationship with Brian Cameron and family.  [facility] will reach out to PMT directly if any needs arise.   Thank you for allowing PMT to assist in the care of this patient.  Brian Cameron [facility] Palliative Medicine Team Team Phone: [phone] NO CHARGE

## 2021-10-28 NOTE — Progress Notes (Signed)
°   10/28/21 1707  Clinical Encounter Type  Visited With Family;Health care provider  Visit Type Spiritual support;Initial  Referral From Nurse (Paged by Primary Nurse, Lenna Sciara)  Consult/Referral To Chaplain  Spiritual Encounters  Spiritual Needs Emotional;Grief support;Prayer  Stress Factors  Patient Stress Factors Not reviewed  Family Stress Factors Loss;Major life changes   Patient's primary nurse requested Chaplain consultation with patient's sister. Met with patient's sister, [name] in patient's room. [name] is the only living family member of patient. She shared her heartfelt sadness of patient's declining health and decreased level of consciousness. [name] shared her concern about her brother's Darrick Meigs beliefs and salvation. She shared that they were raised in a [employer] tradition, and that her brother had previously told her that he had been baptized. I reminded her that by their [name] tradition that he would only have been baptized after proclaiming his belief and acceptance of Jesus Christ as his Ambulance person. At [facility] request we prayed that her brother not suffer and that she would be comforted during this very difficult time. I encouraged [name] to continue her prayer's as well and to continue her conversation with [name] even if it did not appear that he was responding.  Patient was taken down for diagnostic testing at which time [name] left for the evening. We continued our conversation as I escorted [name] to the main entrance. Chaplain is available for continued support as patient and family request. [name], M.Min., [phone].

## 2021-10-28 NOTE — Progress Notes (Signed)
Assessment difficult to complete due to patient non responsive to commands, responsive to pain/touch only. MD made aware. Patient is in bed with bed alarm on, bed locked/lowered and call bell in place. Cardiac monitor on.

## 2021-10-28 NOTE — Significant Event (Signed)
I was notified about patient MRI results showing large PCA stroke.  Discussed with patient's primary attending Dr. Cruzita Lederer.  I notified on-call neurologist Dr. Marita Kansas.  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  Given that patient is high risk from bleeding from thrombocytopenia and also potential for subarachnoid hemorrhage we will hold off any antiplatelet for secondary stroke prophylaxis.  Patient's prognosis is poor.  Will notify patient's sister.  Brian Cameron

## 2021-10-28 NOTE — Assessment & Plan Note (Signed)
-  patient was noted to be confused with increased weakness by his sister.  Unclear etiology at this time, possibly UTI given presence of bacteria but no significant pyuria.  Started on ceftriaxone, monitor cultures

## 2021-10-28 NOTE — Progress Notes (Signed)
°  Transition of Care [facility]) Screening Note   Patient Details  Name: Brian Cameron Date of Birth: 03/21/1946   Transition of Care [facility]) CM/SW Contact:    Geralynn Ochs, LCSW Phone Number: 10/28/2021, 4:10 PM    Transition of Care Department [facility]) has reviewed patient and no TOC needs have been identified at this time; medical workup ongoing. We will continue to monitor patient advancement through interdisciplinary progression rounds. If new patient transition needs arise, please place a TOC consult.

## 2021-10-28 NOTE — ED Notes (Addendum)
Pt at 100% on 4L. Breathing has normalized closer to baseline for this visit. Titrating back to 2L.

## 2021-10-28 NOTE — Assessment & Plan Note (Signed)
-  Baseline creatinine around 1.3-1.4, 1.84 on admission, improved a little bit to 1.7 this morning.  Monitor after receiving Lasix.  He did not agree to Foley catheter in the past, avoid for now

## 2021-10-28 NOTE — Hospital Course (Signed)
Brian Cameron is a 76 y.o. male with medical history significant of metastatic cancer, anemia, thrombocytopenia presenting with altered mental status and shortness of breath. 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  At that time patient declined Foley catheter, rectal exam, biopsy evaluation.  He was discharged home with hospice/palliative care which he has been receiving per sister.

## 2021-10-28 NOTE — Assessment & Plan Note (Signed)
-  recently hospitalized and diagnosed with metastatic cancer, suspect to be prostate.  Please refer to prior discharge summary in December 2022 but he essentially did not want any further work-up, declined Foley catheter, rectal exam, biopsy and was sent home with hospice.  He is DNR.  Palliative care consulted, if his encephalopathy persists and he has poor p.o. intake may benefit from residential hospice at this point

## 2021-10-28 NOTE — Progress Notes (Signed)
PROGRESS NOTE  Brian Cameron RTM:[id number] DOB: 1946-04-21 DOA: 10/27/2021 PCP: Pcp, No   LOS: 0 days   Brief Narrative / Interim history: Brian Cameron is a 76 y.o. male with medical history significant of metastatic cancer, anemia, thrombocytopenia presenting with altered mental status and shortness of breath. 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  At that time patient declined Foley catheter, rectal exam, biopsy evaluation.  He was discharged home with hospice/palliative care which he has been receiving per sister.  Subjective / 24h Interval events: -Poorly responsive, confused  Assessment & Plan: Principal problem Acute metabolic encephalopathy -patient was noted to be confused with increased weakness by his sister.  Unclear etiology at this time, possibly UTI given presence of bacteria but no significant pyuria.  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  Please refer to prior discharge summary in December 2022 but he essentially did not want any further work-up, declined Foley catheter, rectal exam, biopsy and was sent home with hospice.  He is DNR.  Palliative care consulted, if his encephalopathy persists and he has poor p.o. intake may benefit from residential hospice at this point  Bladder outlet obstruction-CT scan on admission shows unchanged moderate bilateral hydronephrosis and hydroureter with thick-walled urinary bladder and prostatomegaly, suggesting chronic outlet obstruction  Thrombocytopenia -possibly in the setting of advanced malignancy  Anemia-possibly in the setting of advanced malignancy  Pleural effusions-with elevated BNP suggesting a degree of acute on chronic diastolic CHF versus malignant pleural effusions.  Most recent 2D echo done  in December 2022 showed normal EF 55-60%, LVH, could not evaluate diastolic dysfunction.  Acute kidney injury on chronic kidney disease stage IIIa -Baseline creatinine around 1.3-1.4, 1.84 on admission, improved a little bit to 1.7 this morning.  Monitor after receiving Lasix.  He did not agree to Foley catheter in the past, avoid for now  Scheduled Meds:  sodium chloride   Intravenous Once   sodium chloride flush  3 mL Intravenous Q12H   Continuous Infusions:  sodium chloride     cefTRIAXone (ROCEPHIN)  IV Stopped (10/28/21 0841)   PRN Meds:.acetaminophen **OR** acetaminophen, albuterol, HYDROmorphone (DILAUDID) injection, LORazepam, polyethylene glycol  Diet Orders (From admission, onward)     Start     Ordered   10/27/21 1927  Diet regular Room service appropriate? Yes; Fluid consistency: Thin  Diet effective now       Question Answer Comment  Room service appropriate? Yes   Fluid consistency: Thin      10/27/21 1927            DVT prophylaxis: SCDs Start: 10/27/21 1925   Lab Results  Component Value Date   PLT 47 (L) 10/28/2021      Code Status: DNR  Family Communication: no family at bedside   Status is: Inpatient Remains inpatient appropriate because: Persistent confusion  Planned Discharge Destination:  TBD  Level of care: Telemetry Medical  Consultants:  Palliative care  Procedures:  none  Microbiology  Urine cultures - NGTD  Antimicrobials: Ceftriaxone 2/1     Objective: Vitals:   10/28/21 0700 10/28/21 0730 10/28/21 0830 10/28/21 0900  BP: (!) 133/51 (!) 127/54 (!) 135/52 (!) 145/56  Pulse: 84 84 79 82  Resp: $Remo'16 16 13 12  'lnjty$ Temp:      TempSrc:  SpO2: 95% 91% 91% 96%    Intake/Output Summary (Last 24 hours) at 10/28/2021 1021 Last data filed at 10/28/2021 0841 Gross per 24 hour  Intake 1706 ml  Output 1650 ml  Net 56 ml   Wt Readings from Last 3 Encounters:  09/21/21 61.2 kg    Examination:  Constitutional: Altered Eyes: no  scleral icterus ENMT: Mucous membranes are moist.  Neck: normal, supple Respiratory: clear to auscultation bilaterally, no wheezing, no crackles.  Cardiovascular: Regular rate and rhythm, no murmurs / rubs / gallops. Abdomen: non distended, no tenderness. Bowel sounds positive.  Musculoskeletal: no clubbing / cyanosis.  Skin: no rashes Neurologic: Altered, does not follow commands   Data Reviewed: I have independently reviewed following labs and imaging studies  CBC Recent Labs  Lab 10/27/21 1542 10/27/21 1628 10/28/21 0221 10/28/21 0238  WBC 6.6  --  7.1  --   HGB 7.5* 7.5* 7.8* 8.2*  HCT 25.6* 22.0* 26.1* 24.0*  PLT 22*  --  47*  --   MCV 106.7*  --  104.8*  --   MCH 31.3  --  31.3  --   MCHC 29.3*  --  29.9*  --   RDW 18.8*  --  18.9*  --   LYMPHSABS 1.4  --   --   --   MONOABS 0.5  --   --   --   EOSABS 0.0  --   --   --   BASOSABS 0.1  --   --   --     Recent Labs  Lab 10/27/21 1542 10/27/21 1602 10/27/21 1628 10/28/21 0221 10/28/21 0238  NA 144  --  143 144 145  K 4.6  --  4.6 4.4 4.4  CL 117*  --  117* 119*  --   CO2 16*  --   --  17*  --   GLUCOSE 110*  --  105* 104*  --   BUN 30*  --  29* 28*  --   CREATININE 1.84*  --  1.80* 1.72*  --   CALCIUM 8.1*  --   --  8.0*  --   AST 37  --   --  43*  --   ALT 12  --   --  11  --   ALKPHOS 422*  --   --  402*  --   BILITOT 0.4  --   --  0.6  --   ALBUMIN 2.5*  --   --  2.4*  --   MG  --   --   --  2.1  --   INR  --  1.2  --   --   --   AMMONIA 25  --   --   --   --   BNP  --   --   --  926.8*  --     ------------------------------------------------------------------------------------------------------------------ No results for input(s): CHOL, HDL, LDLCALC, TRIG, CHOLHDL, LDLDIRECT in the last 72 hours.  No results found for: HGBA1C ------------------------------------------------------------------------------------------------------------------ No results for input(s): TSH, T4TOTAL, T3FREE, THYROIDAB  in the last 72 hours.  Invalid input(s): FREET3  Cardiac Enzymes No results for input(s): CKMB, TROPONINI, MYOGLOBIN in the last 168 hours.  Invalid input(s): CK ------------------------------------------------------------------------------------------------------------------    Component Value Date/Time   BNP 926.8 (H) 10/28/2021 0221    CBG: Recent Labs  Lab 10/27/21 1532  GLUCAP 118*    Recent Results (from the past 240 hour(s))  Resp Panel by RT-PCR (Flu A&B,  Covid) Nasopharyngeal Swab     Status: None   Collection Time: 10/27/21  4:02 PM   Specimen: Nasopharyngeal Swab; Nasopharyngeal(NP) swabs in vial transport medium  Result Value Ref Range Status   SARS Coronavirus 2 by RT PCR NEGATIVE NEGATIVE Final    Comment: (NOTE) SARS-CoV-2 target nucleic acids are NOT DETECTED.  The SARS-CoV-2 RNA is generally detectable in upper respiratory specimens during the acute phase of infection. The lowest concentration of SARS-CoV-2 viral copies this assay can detect is 138 copies/mL. A negative result does not preclude SARS-Cov-2 infection and should not be used as the sole basis for treatment or other patient management decisions. A negative result may occur with  improper specimen collection/handling, submission of specimen other than nasopharyngeal swab, presence of viral mutation(s) within the areas targeted by this assay, and inadequate number of viral copies(<138 copies/mL). A negative result must be combined with clinical observations, patient history, and epidemiological information. The expected result is Negative.  Fact Sheet for Patients:  EntrepreneurPulse.com.au  Fact Sheet for Healthcare Providers:  IncredibleEmployment.be  This test is no t yet approved or cleared by the [place] FDA and  has been authorized for detection and/or diagnosis of SARS-CoV-2 by FDA under an Emergency Use Authorization (EUA). This EUA will  remain  in effect (meaning this test can be used) for the duration of the COVID-19 declaration under Section 564(b)(1) of the Act, 21 U.S.C.section 360bbb-3(b)(1), unless the authorization is terminated  or revoked sooner.       Influenza A by PCR NEGATIVE NEGATIVE Final   Influenza B by PCR NEGATIVE NEGATIVE Final    Comment: (NOTE) The Xpert Xpress SARS-CoV-2/FLU/RSV plus assay is intended as an aid in the diagnosis of influenza from Nasopharyngeal swab specimens and should not be used as a sole basis for treatment. Nasal washings and aspirates are unacceptable for Xpert Xpress SARS-CoV-2/FLU/RSV testing.  Fact Sheet for Patients: EntrepreneurPulse.com.au  Fact Sheet for Healthcare Providers: IncredibleEmployment.be  This test is not yet approved or cleared by the [place] FDA and has been authorized for detection and/or diagnosis of SARS-CoV-2 by FDA under an Emergency Use Authorization (EUA). This EUA will remain in effect (meaning this test can be used) for the duration of the COVID-19 declaration under Section 564(b)(1) of the Act, 21 U.S.C. section 360bbb-3(b)(1), unless the authorization is terminated or revoked.  Performed at [facility], [place] [address] [place], [place] [address]      Radiology Studies: CT HEAD WO CONTRAST (5MM)  Result Date: 10/27/2021 CLINICAL DATA:  Mental status change, unknown cause EXAM: CT HEAD WITHOUT CONTRAST TECHNIQUE: Contiguous axial images were obtained from the base of the skull through the vertex without intravenous contrast. RADIATION DOSE REDUCTION: This exam was performed according to the departmental dose-optimization program which includes automated exposure control, adjustment of the mA and/or kV according to patient size and/or use of iterative reconstruction technique. COMPARISON:  CT head 01/14/2013 BRAIN: BRAIN Cerebral ventricle sizes are concordant with the degree of cerebral  volume loss. Patchy and confluent areas of decreased attenuation are noted throughout the deep and periventricular white matter of the cerebral hemispheres bilaterally, compatible with chronic microvascular ischemic disease. No evidence of large-territorial acute infarction. No parenchymal hemorrhage. No mass lesion. No extra-axial collection. No mass effect or midline shift. No hydrocephalus. Basilar cisterns are patent. Vascular: No hyperdense vessel. Atherosclerotic calcifications are present within the cavernous internal carotid arteries. Skull: No acute fracture or focal lesion. Sinuses/Orbits: Paranasal sinuses and mastoid air  cells are clear. Left lens replacement. Otherwise the orbits are unremarkable. Other: None. IMPRESSION: No acute intracranial abnormality in a patient with cerebral and cerebellar atrophy as well as chronic microvascular ischemic changes. Electronically Signed   By: Iven Finn M.D.   On: 10/27/2021 17:57   DG Pelvis Portable  Result Date: 10/27/2021 CLINICAL DATA:  Trauma, fall EXAM: PORTABLE PELVIS 1-2 VIEWS COMPARISON:  None. FINDINGS: There are numerous sclerotic lesions in the visualized bony structures. No recent fracture or dislocation is seen. Degenerative changes are noted in the right hip. IMPRESSION: Extensive sclerotic metastatic disease in the bony structures. No recent displaced fracture or dislocation is seen. Degenerative changes are noted in the right hip. Electronically Signed   By: Elmer Picker M.D.   On: 10/27/2021 16:55   CT CHEST ABDOMEN PELVIS W CONTRAST  Result Date: 10/27/2021 CLINICAL DATA:  Prostate cancer, assess treatment response EXAM: CT CHEST, ABDOMEN, AND PELVIS WITH CONTRAST TECHNIQUE: Multidetector CT imaging of the chest, abdomen and pelvis was performed following the standard protocol during bolus administration of intravenous contrast. RADIATION DOSE REDUCTION: This exam was performed according to the departmental dose-optimization  program which includes automated exposure control, adjustment of the mA and/or kV according to patient size and/or use of iterative reconstruction technique. CONTRAST:  34mL OMNIPAQUE IOHEXOL 300 MG/ML  SOLN COMPARISON:  CT abdomen and pelvis dated September 21, 2021 FINDINGS: CT CHEST FINDINGS Cardiovascular: Normal heart size. No pericardial effusion. No suspicious filling defects of the central pulmonary arteries. Atherosclerotic disease of the thoracic aorta and coronary arteries. Mediastinum/Nodes: No pathologically enlarged lymph nodes seen in the chest. Thyroid and esophagus are unremarkable. Lungs/Pleura: Large right and small left pleural effusions which are slightly increased in size when compared with prior exam. Central airways are patent. Bibasilar atelectasis. Stable solid pulmonary nodule of the right middle lobe measuring 8 mm on series 4, image 53. Musculoskeletal: Diffuse osseous sclerosis, unchanged compared to prior exam. Nondisplaced fracture of the right posterolateral eleventh rib, unchanged compared to prior. CT ABDOMEN PELVIS FINDINGS Hepatobiliary: No focal liver abnormality is seen. No gallstones, gallbladder wall thickening, or biliary dilatation. Pancreas: Unremarkable. No pancreatic ductal dilatation or surrounding inflammatory changes. Spleen: Normal in size without focal abnormality. Adrenals/Urinary Tract: Bilateral adrenal glands are unremarkable. Unchanged moderate bilateral hydronephrosis and hydroureter. Nonobstructing stones of the bilateral kidneys. Thick-walled and trabeculated urinary bladder. Stomach/Bowel: Stomach is within normal limits. No evidence of bowel wall thickening, distention, or inflammatory changes. Vascular/Lymphatic: Aortic atherosclerosis. No enlarged abdominal or pelvic lymph nodes. Reproductive: Prostatomegaly, measuring up to 5.0 cm. Other: Diffuse anasarca. Musculoskeletal: Diffuse osseous sclerosis. IMPRESSION: 1. Large right and small left pleural  effusions, increased in size when compared to prior exam. 2. Stable right middle lobe solid pulmonary nodule. 3. Diffuse sclerotic osseous metastatic disease, unchanged compared to prior exam. 4. Unchanged moderate bilateral hydronephrosis and hydroureter with associated thick-walled urinary bladder and prostatomegaly, findings are likely due to chronic outlet obstruction. Electronically Signed   By: Yetta Glassman M.D.   On: 10/27/2021 18:42   DG CHEST PORT 1 VIEW  Result Date: 10/28/2021 CLINICAL DATA:  Dyspnea EXAM: PORTABLE CHEST 1 VIEW COMPARISON:  CT chest dated 10/28/2011 FINDINGS: Cardiomegaly with mild interstitial edema. Moderate right and small left pleural effusions. Associated compressive atelectasis in the right lower lobe. No pneumothorax. Mild thoracic aortic atherosclerosis. IMPRESSION: Cardiomegaly with mild interstitial edema. Moderate right and small left pleural effusions. Associated compressive atelectasis in the right lower lobe. Electronically Signed   By: Julian Hy  M.D.   On: 10/28/2021 02:34   DG Chest Port 1 View  Result Date: 10/27/2021 CLINICAL DATA:  Altered mental status EXAM: PORTABLE CHEST 1 VIEW COMPARISON:  Previous studies including the examination of 09/23/2021 FINDINGS: Transverse diameter of heart is increased. Small to moderate bilateral pleural effusions are seen, more so on the right side with interval increase. Central pulmonary vessels are more prominent. Interstitial and alveolar markings are seen in the parahilar regions and lower lung fields, more so on the right side. There is no pneumothorax. Sclerotic lesions are seen in bony structures suggesting skeletal metastatic disease. IMPRESSION: Cardiomegaly. There is interval increase in pulmonary vascular congestion suggesting CHF. Increased interstitial markings are seen in the left parahilar region and left lower lung fields. Alveolar densities seen in the right parahilar region and right lower lung  fields. Findings suggest pulmonary edema, more so on the right side. Possibility of underlying pneumonia is not excluded. Small to moderate bilateral pleural effusions with interval worsening. Electronically Signed   By: Elmer Picker M.D.   On: 10/27/2021 17:03     Marzetta Board, MD, PhD [facility]  Between 7 am - 7 pm I am available, please contact me via Amion (for emergencies) or Securechat (non urgent messages)  Between 7 pm - 7 am I am not available, please contact night coverage MD/APP via Amion

## 2021-10-29 DIAGNOSIS — G9341 Metabolic encephalopathy: Secondary | ICD-10-CM

## 2021-10-29 DIAGNOSIS — N179 Acute kidney failure, unspecified: Secondary | ICD-10-CM

## 2021-10-29 LAB — URINE CULTURE: Culture: NO GROWTH

## 2021-10-29 MED ORDER — MORPHINE SULFATE (PF) 2 MG/ML IV SOLN
2.0000 mg | INTRAVENOUS | Status: DC | PRN
  Administered 2021-10-29: 2 mg via INTRAVENOUS
  Filled 2021-10-29: qty 1

## 2021-10-29 NOTE — Progress Notes (Signed)
[facility] 3W 04  - Manufacturing engineer [facility]) - Hospitalized Hospice Patient    Mr. 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  ACC was notified by sister after patient was transported from home to the [facility] ED for evaluation after patient was found at home lying on the floor from possible fall.   Patient was admitted on 10/27/21 with Acute Metabolic Encephalopathy. Per Dr. Karie Georges of [facility], this is a related admission. Patient is a DNR.   Checked in with RN, Patient has been tachycardic today (HR 130s) with increased secretions, continued lack of PO intake and blood noted in his Foley catheter output.  Also spoke with Dr. Cruzita Lederer to discuss patient sister's continued wishes for comfort care and possibility of transferring patient to residential hospice. MD will be evaluating patient this afternoon to see if Mr. Saintjean is medically stable for transfer to residential hospice. During bedside visit, patient was nonresponsive to verbal stimuli in NAD but audible secretions were noted, RR was shallow, on 2.5L O2 via [place]. No visitors at bedside so reached out to patient POA/sister Butch Penny to support and update on hospitalization who is aware that [facility] will continue to follow patient daily during this hospital admission. Family has ACC contact information and encouraged to reach out to as needed for support. Butch Penny would be willing to accept a bed (if available) at residential hospice if Hospital MD clears patient for transfer today.   Patient is appropriate for inpatient level of care due to the ongoing assessment of current encephalopathy symptoms, persistent confusion and management of agitation with IV medications.    V/S:  97.6, 101, 18, 144/62, 99% sats on 2L O2 via [place] I&O: 205.11/1248 (-1044.7) Abnormal lab work: On 10/28/21 Hemo 8.2, HCT 24.0, Albumin 2.4,CL 119, CO2 17, Glucose 110, BUN 28, Creat 1.72, Cal 8.0, AST 43, BNP 926.8 Covid  negative on 10/27/21 New Diagnostics:   10/28/21 MRI Brain shows large R PCA stroke with petechial hemorrhage vs SAH.  IVs/PRNs: NS IVF at 64mls/hr, Ativan 1 mg IV adm at [place] on 2/1 Skin: Abrasion to left face noted, Stage 2 coccyx pressure injury   Per EPIC New MD Note:    I was notified about patient MRI results showing large PCA stroke.  Discussed with patient's primary attending Dr. Cruzita Lederer.  I notified on-call neurologist Dr. Marita [place].  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  Given that patient is high risk from bleeding from thrombocytopenia and also potential for subarachnoid hemorrhage we will hold off any antiplatelet for secondary stroke prophylaxis.  Patient's prognosis is poor.  Will notify patient's sister.          D/C planning- Ongoing assessment - Per MD conversation, patient prognosis is poor and may be actively dying/unstable for discharge. If patient stabilizes, may be eligible to transfer to residential hospice for EOL care. Please use [facility] for transportation needs upon discharge of all [facility] patient.  Goals of Care: Clear - Patient is a DNR and family wishes to continue with the support of hospice during hospitalization and open to residential hospice if patient stabilizes. Communication with [facility] [place] team updated on patient condition. Transfer summary and Current Medication List scanned in [facility] ED to EPIC. Communication with [facility] - Supported patient sister/POA on phone.    Please call with any hospice related questions/concerns,  Gar Ponto, RN [facility] Hospital Liaison (in [place])

## 2021-10-29 NOTE — TOC Transition Note (Signed)
Transition of Care [facility]) - CM/SW Discharge Note   Patient Details  Name: Brian Cameron MRN: [id number] Date of Birth: Mar 17, 1946  Transition of Care [facility]) CM/SW Contact:  Geralynn Ochs, LCSW Phone Number: 10/29/2021, 4:25 PM   Clinical Narrative:   CSW coordinated with MD, RN, and hospice liaison for patient to discharge to [facility] today. Transport arranged with [facility].  Nurse to call report to [phone].    Final next level of care: [place] Barriers to Discharge: Barriers Resolved   Patient Goals and CMS Choice        Discharge Placement                       Discharge Plan and Services                                     Social Determinants of Health (SDOH) Interventions     Readmission Risk Interventions Readmission Risk Prevention Plan 09/24/2021  Transportation Screening Complete  PCP or Specialist Appt within 5-7 Days Complete  Home Care Screening Complete  Medication Review (RN CM) Complete  Some recent data might be hidden

## 2021-10-29 NOTE — Progress Notes (Signed)
Attending notfied via secure chat that patient has heart rate in the 130s sustaining. Order for stat ekg given by Dr. Lars Pinks.

## 2021-10-29 NOTE — Progress Notes (Addendum)
Manufacturing engineer [facility]) Hospital Liaison Note  Received request from family for interest in [facility] with request for transfer as soon as patient is medically stable. Chart reviewed and patient is eligible for residential hospice per Dr. Tomasa Hosteller of [facility]. Dr. Cruzita Lederer is agreeable to transfer after evaluating that patient is medically stable for discharge at this time.  Spoke to patient family to confirm interest and explain services. Family agreeable to transfer today. TOC aware.    Awaiting Registration paperwork to be completed by family and will update this note when done and patient is ready for transfer to [facility].  Please fax discharge summary to [phone].   RN please call report to [phone].   Thank you,    Gar Ponto, RN [facility] Liaison  [phone]  UPDATE AT [place] PLACE REGISTRATION PAPERWORK AND IS READY FOR TRANSFER.

## 2021-10-29 NOTE — Discharge Summary (Signed)
Physician Discharge Summary  Brian Cameron OEH:[id number] DOB: 1945-12-23 DOA: 10/27/2021  PCP: Pcp, No  Admit date: 10/27/2021 Discharge date: 10/29/2021  Admitted From: home Disposition:  residential hospice  Discharge Condition: guarded CODE STATUS: DNR Diet recommendation: comfort feeding  HPI: Per admitting MD, Brian Cameron is a 76 y.o. male with medical history significant of metastatic cancer, anemia, thrombocytopenia presenting with altered mental status and shortness of breath. 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  At that time patient declined Foley catheter, rectal exam, biopsy evaluation.  He was discharged home with hospice/palliative care which he has been receiving per sister. He is DNR and primarily comfort care and focus on symptom management.  She would like for him to receive evaluation and care if encephalopathy is reversible.  We did discuss that this may be progression of his disease and this may not improve and he may continue to decline more precipitously. Patient does live alone but sister visits him daily.  Notes that he seemed weaker yesterday and today he was confused and found lying on the floor.  Has been combative at times in the ED.  Possible nonspecific weakness. Unable be accurately obtained due to patient's drowsiness and encephalopathy.  Hospital Course / Discharge diagnoses: Principal problem Acute metabolic encephalopathy due to acute CVA -patient was noted to be confused with increased weakness by his sister.  He underwent an MRI which showed a large acute right PCA territory infarct, involving the right occipital lobe, medial right temporal lobe and right thalamocapsular region. Associated edema without significant mass effect. Multiple additional small acute infarcts in the perirolandic cortex bilaterally. Given poor prognosis, no chance of  meaningful recovery, and per patient prior wishes, and per his family, will be switched to comfort care and discharged to residential hospice  Active problems Metastatic cancer -recently hospitalized and diagnosed with metastatic cancer, suspect to be prostate.  Please refer to prior discharge summary in December 2022 but he essentially did not want any further work-up, declined Foley catheter, rectal exam, biopsy and was sent home with hospice.  Now on comfort care Bladder outlet obstruction-CT scan on admission shows unchanged moderate bilateral hydronephrosis and hydroureter with thick-walled urinary bladder and prostatomegaly, suggesting chronic outlet obstruction Thrombocytopenia -possibly in the setting of advanced malignancy Anemia-possibly in the setting of advanced malignancy Pleural effusions-with elevated BNP suggesting a degree of acute on chronic diastolic CHF versus malignant pleural effusions.  Most recent 2D echo done in December 2022 showed normal EF 55-60%, LVH, could not evaluate diastolic dysfunction. Acute kidney injury on chronic kidney disease stage IIIa -Baseline creatinine around 1.3-1.4, 1.84 on admission  Metabolic acidosis Elevated alkaline phosphatase - due to bony mets Severe protein calorie malnutrition  Sepsis ruled out   Discharge Instructions   Allergies as of 10/29/2021   No Known Allergies      Medication List     STOP taking these medications    acetaminophen 500 MG tablet Commonly known as: TYLENOL   albuterol (2.5 MG/3ML) 0.083% nebulizer solution Commonly known as: PROVENTIL   BC Fast Pain Relief 845-65 MG Pack Generic drug: Aspirin-Caffeine   ondansetron 4 MG tablet Commonly known as: ZOFRAN       Consultations: none  Procedures/Studies:  CT HEAD WO CONTRAST (5MM)  Result Date: 10/27/2021 CLINICAL DATA:  Mental status change, unknown cause EXAM: CT HEAD WITHOUT CONTRAST TECHNIQUE: Contiguous axial images were obtained from  the  base of the skull through the vertex without intravenous contrast. RADIATION DOSE REDUCTION: This exam was performed according to the departmental dose-optimization program which includes automated exposure control, adjustment of the mA and/or kV according to patient size and/or use of iterative reconstruction technique. COMPARISON:  CT head 01/14/2013 BRAIN: BRAIN Cerebral ventricle sizes are concordant with the degree of cerebral volume loss. Patchy and confluent areas of decreased attenuation are noted throughout the deep and periventricular white matter of the cerebral hemispheres bilaterally, compatible with chronic microvascular ischemic disease. No evidence of large-territorial acute infarction. No parenchymal hemorrhage. No mass lesion. No extra-axial collection. No mass effect or midline shift. No hydrocephalus. Basilar cisterns are patent. Vascular: No hyperdense vessel. Atherosclerotic calcifications are present within the cavernous internal carotid arteries. Skull: No acute fracture or focal lesion. Sinuses/Orbits: Paranasal sinuses and mastoid air cells are clear. Left lens replacement. Otherwise the orbits are unremarkable. Other: None. IMPRESSION: No acute intracranial abnormality in a patient with cerebral and cerebellar atrophy as well as chronic microvascular ischemic changes. Electronically Signed   By: Iven Finn M.D.   On: 10/27/2021 17:57   MR BRAIN WO CONTRAST  Result Date: 10/28/2021 CLINICAL DATA:  Mental status change, unknown cause EXAM: MRI HEAD WITHOUT CONTRAST TECHNIQUE: Multiplanar, multiecho pulse sequences of the brain and surrounding structures were obtained without intravenous contrast. COMPARISON:  CT head 10/27/2021. FINDINGS: Brain: Large acute right PCA territory infarct, involving the right occipital lobe, medial right temporal lobe and right thalamocapsular region. Associated edema without significant mass effect. Multiple additional small acute infarcts in the high  perirolandic cortex bilaterally. Remote cerebellar infarcts bilaterally. Areas of curvilinear susceptibility artifact along the right parietal cortex and left perirolandic cortex. No mass occupying acute hemorrhage. Additional patchy T2/FLAIR hyperintensity in the white matter, nonspecific but compatible with chronic microvascular disease. No hydrocephalus or midline shift. No evidence of mass lesion on this noncontrast study. Vascular: Abnormal signal within the intradural left vertebral artery. Skull and upper cervical spine: Diffuse T1 hypointensity of the bone marrow. Sinuses/Orbits: Mild paranasal sinus mucosal thickening. Unremarkable orbits. Other: Bilateral mastoid effusions. IMPRESSION: 1. Large acute right PCA territory infarct, involving the right occipital lobe, medial right temporal lobe and right thalamocapsular region. Associated edema without significant mass effect. 2. Multiple additional small acute infarcts in the perirolandic cortex bilaterally. 3. Abnormal signal within the intradural left vertebral artery. Recommend CTA or MRA to assess for stenosis or occlusion. 4. Mild areas of curvilinear susceptibility artifact along the right parietal and left perirolandic cortex, possibly petechial hemorrhage associated with above infarcts versus sequela of prior subarachnoid hemorrhage. No mass occupying acute hemorrhage. 5. Diffuse T1 hypointensity the bone marrow, likely related to diffuse osseous metastatic disease given the patient's known prostate cancer and findings on prior CT chest/abdomen/pelvis. Postcontrast imaging could provide more sensitive evaluation for metastatic disease if clinically indicated. 6. Remote cerebellar infarcts bilaterally and chronic microvascular disease. These results will be called to the ordering clinician or representative by the Radiologist Assistant, and communication documented in the PACS or [employer]. Electronically Signed   By: Margaretha Sheffield M.D.    On: 10/28/2021 19:36   DG Pelvis Portable  Result Date: 10/27/2021 CLINICAL DATA:  Trauma, fall EXAM: PORTABLE PELVIS 1-2 VIEWS COMPARISON:  None. FINDINGS: There are numerous sclerotic lesions in the visualized bony structures. No recent fracture or dislocation is seen. Degenerative changes are noted in the right hip. IMPRESSION: Extensive sclerotic metastatic disease in the bony structures. No recent displaced fracture or  dislocation is seen. Degenerative changes are noted in the right hip. Electronically Signed   By: Elmer Picker M.D.   On: 10/27/2021 16:55   CT CHEST ABDOMEN PELVIS W CONTRAST  Result Date: 10/27/2021 CLINICAL DATA:  Prostate cancer, assess treatment response EXAM: CT CHEST, ABDOMEN, AND PELVIS WITH CONTRAST TECHNIQUE: Multidetector CT imaging of the chest, abdomen and pelvis was performed following the standard protocol during bolus administration of intravenous contrast. RADIATION DOSE REDUCTION: This exam was performed according to the departmental dose-optimization program which includes automated exposure control, adjustment of the mA and/or kV according to patient size and/or use of iterative reconstruction technique. CONTRAST:  16mL OMNIPAQUE IOHEXOL 300 MG/ML  SOLN COMPARISON:  CT abdomen and pelvis dated September 21, 2021 FINDINGS: CT CHEST FINDINGS Cardiovascular: Normal heart size. No pericardial effusion. No suspicious filling defects of the central pulmonary arteries. Atherosclerotic disease of the thoracic aorta and coronary arteries. Mediastinum/Nodes: No pathologically enlarged lymph nodes seen in the chest. Thyroid and esophagus are unremarkable. Lungs/Pleura: Large right and small left pleural effusions which are slightly increased in size when compared with prior exam. Central airways are patent. Bibasilar atelectasis. Stable solid pulmonary nodule of the right middle lobe measuring 8 mm on series 4, image 53. Musculoskeletal: Diffuse osseous sclerosis, unchanged  compared to prior exam. Nondisplaced fracture of the right posterolateral eleventh rib, unchanged compared to prior. CT ABDOMEN PELVIS FINDINGS Hepatobiliary: No focal liver abnormality is seen. No gallstones, gallbladder wall thickening, or biliary dilatation. Pancreas: Unremarkable. No pancreatic ductal dilatation or surrounding inflammatory changes. Spleen: Normal in size without focal abnormality. Adrenals/Urinary Tract: Bilateral adrenal glands are unremarkable. Unchanged moderate bilateral hydronephrosis and hydroureter. Nonobstructing stones of the bilateral kidneys. Thick-walled and trabeculated urinary bladder. Stomach/Bowel: Stomach is within normal limits. No evidence of bowel wall thickening, distention, or inflammatory changes. Vascular/Lymphatic: Aortic atherosclerosis. No enlarged abdominal or pelvic lymph nodes. Reproductive: Prostatomegaly, measuring up to 5.0 cm. Other: Diffuse anasarca. Musculoskeletal: Diffuse osseous sclerosis. IMPRESSION: 1. Large right and small left pleural effusions, increased in size when compared to prior exam. 2. Stable right middle lobe solid pulmonary nodule. 3. Diffuse sclerotic osseous metastatic disease, unchanged compared to prior exam. 4. Unchanged moderate bilateral hydronephrosis and hydroureter with associated thick-walled urinary bladder and prostatomegaly, findings are likely due to chronic outlet obstruction. Electronically Signed   By: Yetta Glassman M.D.   On: 10/27/2021 18:42   DG CHEST PORT 1 VIEW  Result Date: 10/28/2021 CLINICAL DATA:  Dyspnea EXAM: PORTABLE CHEST 1 VIEW COMPARISON:  CT chest dated 10/28/2011 FINDINGS: Cardiomegaly with mild interstitial edema. Moderate right and small left pleural effusions. Associated compressive atelectasis in the right lower lobe. No pneumothorax. Mild thoracic aortic atherosclerosis. IMPRESSION: Cardiomegaly with mild interstitial edema. Moderate right and small left pleural effusions. Associated compressive  atelectasis in the right lower lobe. Electronically Signed   By: Julian Hy M.D.   On: 10/28/2021 02:34   DG Chest Port 1 View  Result Date: 10/27/2021 CLINICAL DATA:  Altered mental status EXAM: PORTABLE CHEST 1 VIEW COMPARISON:  Previous studies including the examination of 09/23/2021 FINDINGS: Transverse diameter of heart is increased. Small to moderate bilateral pleural effusions are seen, more so on the right side with interval increase. Central pulmonary vessels are more prominent. Interstitial and alveolar markings are seen in the parahilar regions and lower lung fields, more so on the right side. There is no pneumothorax. Sclerotic lesions are seen in bony structures suggesting skeletal metastatic disease. IMPRESSION: Cardiomegaly. There is interval increase in  pulmonary vascular congestion suggesting CHF. Increased interstitial markings are seen in the left parahilar region and left lower lung fields. Alveolar densities seen in the right parahilar region and right lower lung fields. Findings suggest pulmonary edema, more so on the right side. Possibility of underlying pneumonia is not excluded. Small to moderate bilateral pleural effusions with interval worsening. Electronically Signed   By: Elmer Picker M.D.   On: 10/27/2021 17:03     Subjective: unresponsive  Discharge Exam: BP (!) 144/62 (BP Location: Left Arm)    Pulse (!) 101    Temp 97.6 F (36.4 C) (Oral)    Resp 18    Wt 53.2 kg    SpO2 99%    BMI 15.47 kg/m   General: unresponsive    The results of significant diagnostics from this hospitalization (including imaging, microbiology, ancillary and laboratory) are listed below for reference.     Microbiology: Recent Results (from the past 240 hour(s))  Resp Panel by RT-PCR (Flu A&B, Covid) Nasopharyngeal Swab     Status: None   Collection Time: 10/27/21  4:02 PM   Specimen: Nasopharyngeal Swab; Nasopharyngeal(NP) swabs in vial transport medium  Result Value Ref  Range Status   SARS Coronavirus 2 by RT PCR NEGATIVE NEGATIVE Final    Comment: (NOTE) SARS-CoV-2 target nucleic acids are NOT DETECTED.  The SARS-CoV-2 RNA is generally detectable in upper respiratory specimens during the acute phase of infection. The lowest concentration of SARS-CoV-2 viral copies this assay can detect is 138 copies/mL. A negative result does not preclude SARS-Cov-2 infection and should not be used as the sole basis for treatment or other patient management decisions. A negative result may occur with  improper specimen collection/handling, submission of specimen other than nasopharyngeal swab, presence of viral mutation(s) within the areas targeted by this assay, and inadequate number of viral copies(<138 copies/mL). A negative result must be combined with clinical observations, patient history, and epidemiological information. The expected result is Negative.  Fact Sheet for Patients:  EntrepreneurPulse.com.au  Fact Sheet for Healthcare Providers:  IncredibleEmployment.be  This test is no t yet approved or cleared by the [place] FDA and  has been authorized for detection and/or diagnosis of SARS-CoV-2 by FDA under an Emergency Use Authorization (EUA). This EUA will remain  in effect (meaning this test can be used) for the duration of the COVID-19 declaration under Section 564(b)(1) of the Act, 21 U.S.C.section 360bbb-3(b)(1), unless the authorization is terminated  or revoked sooner.       Influenza A by PCR NEGATIVE NEGATIVE Final   Influenza B by PCR NEGATIVE NEGATIVE Final    Comment: (NOTE) The Xpert Xpress SARS-CoV-2/FLU/RSV plus assay is intended as an aid in the diagnosis of influenza from Nasopharyngeal swab specimens and should not be used as a sole basis for treatment. Nasal washings and aspirates are unacceptable for Xpert Xpress SARS-CoV-2/FLU/RSV testing.  Fact Sheet for  Patients: EntrepreneurPulse.com.au  Fact Sheet for Healthcare Providers: IncredibleEmployment.be  This test is not yet approved or cleared by the [place] FDA and has been authorized for detection and/or diagnosis of SARS-CoV-2 by FDA under an Emergency Use Authorization (EUA). This EUA will remain in effect (meaning this test can be used) for the duration of the COVID-19 declaration under Section 564(b)(1) of the Act, 21 U.S.C. section 360bbb-3(b)(1), unless the authorization is terminated or revoked.  Performed at [facility], [place] [address], [place], [place] [address]   Urine Culture     Status: None  Collection Time: 10/28/21  7:14 AM   Specimen: Urine, Clean Catch  Result Value Ref Range Status   Specimen Description URINE, CLEAN CATCH  Final   Special Requests NONE  Final   Culture   Final    NO GROWTH Performed at [facility], [address] [place], [place] [address]    Report Status 10/29/2021 FINAL  Final     Labs: Basic Metabolic Panel: Recent Labs  Lab 10/27/21 1542 10/27/21 1628 10/28/21 0221 10/28/21 0238  NA 144 143 144 145  K 4.6 4.6 4.4 4.4  CL 117* 117* 119*  --   CO2 16*  --  17*  --   GLUCOSE 110* 105* 104*  --   BUN 30* 29* 28*  --   CREATININE 1.84* 1.80* 1.72*  --   CALCIUM 8.1*  --  8.0*  --   MG  --   --  2.1  --    Liver Function Tests: Recent Labs  Lab 10/27/21 1542 10/28/21 0221  AST 37 43*  ALT 12 11  ALKPHOS 422* 402*  BILITOT 0.4 0.6  PROT 5.2* 5.4*  ALBUMIN 2.5* 2.4*   CBC: Recent Labs  Lab 10/27/21 1542 10/27/21 1628 10/28/21 0221 10/28/21 0238  WBC 6.6  --  7.1  --   NEUTROABS 4.6  --   --   --   HGB 7.5* 7.5* 7.8* 8.2*  HCT 25.6* 22.0* 26.1* 24.0*  MCV 106.7*  --  104.8*  --   PLT 22*  --  47*  --    CBG: Recent Labs  Lab 10/27/21 1532  GLUCAP 118*   Hgb A1c No results for input(s): HGBA1C in the last 72 hours. Lipid Profile No results for  input(s): CHOL, HDL, LDLCALC, TRIG, CHOLHDL, LDLDIRECT in the last 72 hours. Thyroid function studies Recent Labs    10/28/21 1328  TSH 4.127   Urinalysis    Component Value Date/Time   COLORURINE YELLOW 10/27/2021 2059   APPEARANCEUR CLOUDY (A) 10/27/2021 2059   LABSPEC 1.020 10/27/2021 2059   PHURINE 5.5 10/27/2021 2059   GLUCOSEU NEGATIVE 10/27/2021 2059   HGBUR LARGE (A) 10/27/2021 2059   BILIRUBINUR NEGATIVE 10/27/2021 2059   KETONESUR NEGATIVE 10/27/2021 2059   PROTEINUR NEGATIVE 10/27/2021 2059   NITRITE NEGATIVE 10/27/2021 2059   LEUKOCYTESUR NEGATIVE 10/27/2021 2059    FURTHER DISCHARGE INSTRUCTIONS:   Get Medicines reviewed and adjusted: Please take all your medications with you for your next visit with your Primary MD   Laboratory/radiological data: Please request your Primary MD to go over all hospital tests and procedure/radiological results at the follow up, please ask your Primary MD to get all Hospital records sent to his/her office.   In some cases, they will be blood work, cultures and biopsy results pending at the time of your discharge. Please request that your primary care M.D. goes through all the records of your hospital data and follows up on these results.   Also Note the following: If you experience worsening of your admission symptoms, develop shortness of breath, life threatening emergency, suicidal or homicidal thoughts you must seek medical attention immediately by calling 911 or calling your MD immediately  if symptoms less severe.   You must read complete instructions/literature along with all the possible adverse reactions/side effects for all the Medicines you take and that have been prescribed to you. Take any new Medicines after you have completely understood and accpet all the possible adverse reactions/side effects.  Do not drive when taking Pain medications or sleeping medications (Benzodaizepines)   Do not take more than prescribed  Pain, Sleep and Anxiety Medications. It is not advisable to combine anxiety,sleep and pain medications without talking with your primary care practitioner   Special Instructions: If you have smoked or chewed Tobacco  in the last 2 yrs please stop smoking, stop any regular Alcohol  and or any Recreational drug use.   Wear Seat belts while driving.   Please note: You were cared for by a hospitalist during your hospital stay. Once you are discharged, your primary care physician will handle any further medical issues. Please note that NO REFILLS for any discharge medications will be authorized once you are discharged, as it is imperative that you return to your primary care physician (or establish a relationship with a primary care physician if you do not have one) for your post hospital discharge needs so that they can reassess your need for medications and monitor your lab values.  Time coordinating discharge: 35 minutes  SIGNED:  Marzetta Board, MD, PhD 10/29/2021, 2:55 PM

## 2021-10-29 NOTE — Progress Notes (Signed)
Report called to [facility].  

## 2021-10-30 LAB — VITAMIN B1: Vitamin B1 (Thiamine): 83.3 nmol/L (ref 66.5–200.0)

## 2021-11-24 ENCOUNTER — Encounter (HOSPITAL_COMMUNITY): Payer: Self-pay | Admitting: Radiology

## 2021-11-25 DEATH — deceased
